# Patient Record
Sex: Female | Born: 1970 | Race: White | Hispanic: No | Marital: Married | Smoking: Current every day smoker
Health system: Southern US, Community
[De-identification: ages and names within clinical notes are randomized; demographics above are authoritative.]

## PROBLEM LIST (undated history)

## (undated) DIAGNOSIS — F32A Depression, unspecified: Secondary | ICD-10-CM

## (undated) DIAGNOSIS — F419 Anxiety disorder, unspecified: Secondary | ICD-10-CM

## (undated) DIAGNOSIS — M1990 Unspecified osteoarthritis, unspecified site: Secondary | ICD-10-CM

## (undated) DIAGNOSIS — K219 Gastro-esophageal reflux disease without esophagitis: Secondary | ICD-10-CM

## (undated) HISTORY — PX: CHOLECYSTECTOMY: SHX55

## (undated) HISTORY — PX: ABDOMINAL HYSTERECTOMY: SHX81

## (undated) HISTORY — PX: NASAL SINUS SURGERY: SHX719

---

## 1998-09-27 ENCOUNTER — Other Ambulatory Visit: Admission: RE | Admit: 1998-09-27 | Discharge: 1998-09-27 | Payer: Self-pay | Admitting: Obstetrics and Gynecology

## 1999-05-03 ENCOUNTER — Inpatient Hospital Stay (HOSPITAL_COMMUNITY): Admission: AD | Admit: 1999-05-03 | Discharge: 1999-05-06 | Payer: Self-pay | Admitting: Obstetrics and Gynecology

## 1999-05-03 ENCOUNTER — Encounter (INDEPENDENT_AMBULATORY_CARE_PROVIDER_SITE_OTHER): Payer: Self-pay | Admitting: Specialist

## 1999-06-11 ENCOUNTER — Other Ambulatory Visit: Admission: RE | Admit: 1999-06-11 | Discharge: 1999-06-11 | Payer: Self-pay | Admitting: Obstetrics and Gynecology

## 2000-03-04 ENCOUNTER — Encounter (INDEPENDENT_AMBULATORY_CARE_PROVIDER_SITE_OTHER): Payer: Self-pay

## 2000-03-04 ENCOUNTER — Other Ambulatory Visit: Admission: RE | Admit: 2000-03-04 | Discharge: 2000-03-04 | Payer: Self-pay | Admitting: Otolaryngology

## 2000-06-17 ENCOUNTER — Other Ambulatory Visit: Admission: RE | Admit: 2000-06-17 | Discharge: 2000-06-17 | Payer: Self-pay | Admitting: Obstetrics and Gynecology

## 2000-12-29 ENCOUNTER — Encounter (INDEPENDENT_AMBULATORY_CARE_PROVIDER_SITE_OTHER): Payer: Self-pay | Admitting: Specialist

## 2000-12-29 ENCOUNTER — Ambulatory Visit (HOSPITAL_BASED_OUTPATIENT_CLINIC_OR_DEPARTMENT_OTHER): Admission: RE | Admit: 2000-12-29 | Discharge: 2000-12-29 | Payer: Self-pay | Admitting: *Deleted

## 2001-07-18 ENCOUNTER — Other Ambulatory Visit: Admission: RE | Admit: 2001-07-18 | Discharge: 2001-07-18 | Payer: Self-pay | Admitting: Obstetrics and Gynecology

## 2001-11-09 ENCOUNTER — Encounter: Payer: Self-pay | Admitting: Obstetrics and Gynecology

## 2001-11-09 ENCOUNTER — Ambulatory Visit (HOSPITAL_COMMUNITY): Admission: RE | Admit: 2001-11-09 | Discharge: 2001-11-09 | Payer: Self-pay | Admitting: Obstetrics and Gynecology

## 2002-05-24 ENCOUNTER — Inpatient Hospital Stay (HOSPITAL_COMMUNITY): Admission: AD | Admit: 2002-05-24 | Discharge: 2002-05-24 | Payer: Self-pay | Admitting: Obstetrics and Gynecology

## 2002-09-23 ENCOUNTER — Inpatient Hospital Stay (HOSPITAL_COMMUNITY): Admission: AD | Admit: 2002-09-23 | Discharge: 2002-09-26 | Payer: Self-pay | Admitting: Obstetrics and Gynecology

## 2002-11-06 ENCOUNTER — Other Ambulatory Visit: Admission: RE | Admit: 2002-11-06 | Discharge: 2002-11-06 | Payer: Self-pay | Admitting: Obstetrics and Gynecology

## 2003-11-21 ENCOUNTER — Other Ambulatory Visit: Admission: RE | Admit: 2003-11-21 | Discharge: 2003-11-21 | Payer: Self-pay | Admitting: Obstetrics and Gynecology

## 2004-12-15 ENCOUNTER — Other Ambulatory Visit: Admission: RE | Admit: 2004-12-15 | Discharge: 2004-12-15 | Payer: Self-pay | Admitting: Obstetrics and Gynecology

## 2008-06-04 ENCOUNTER — Encounter (INDEPENDENT_AMBULATORY_CARE_PROVIDER_SITE_OTHER): Payer: Self-pay | Admitting: Obstetrics and Gynecology

## 2008-06-04 ENCOUNTER — Ambulatory Visit (HOSPITAL_COMMUNITY): Admission: RE | Admit: 2008-06-04 | Discharge: 2008-06-05 | Payer: Self-pay | Admitting: Obstetrics and Gynecology

## 2009-10-19 ENCOUNTER — Emergency Department (HOSPITAL_BASED_OUTPATIENT_CLINIC_OR_DEPARTMENT_OTHER): Admission: EM | Admit: 2009-10-19 | Discharge: 2009-10-19 | Payer: Self-pay | Admitting: Emergency Medicine

## 2010-09-22 ENCOUNTER — Other Ambulatory Visit: Payer: Self-pay | Admitting: Obstetrics and Gynecology

## 2010-09-22 DIAGNOSIS — R928 Other abnormal and inconclusive findings on diagnostic imaging of breast: Secondary | ICD-10-CM

## 2010-09-25 ENCOUNTER — Ambulatory Visit
Admission: RE | Admit: 2010-09-25 | Discharge: 2010-09-25 | Disposition: A | Discharge status: Home / Self Care | Payer: BC Managed Care – PPO | Source: Ambulatory Visit | Attending: Obstetrics and Gynecology | Admitting: Obstetrics and Gynecology

## 2010-09-25 DIAGNOSIS — R928 Other abnormal and inconclusive findings on diagnostic imaging of breast: Secondary | ICD-10-CM

## 2010-09-25 IMAGING — MG MM DIGITAL DIAGNOSTIC UNILAT L {BCG}
3 series · 3 of 3 positions shown · non-contrast
Comparison: Multiple priors

CLINICAL DATA: Abnormal screening, left breast

DIGITAL DIAGNOSTIC LEFT MAMMOGRAM WITHOUT CAD AND LEFT BREAST
ULTRASOUND:

[L MLO]
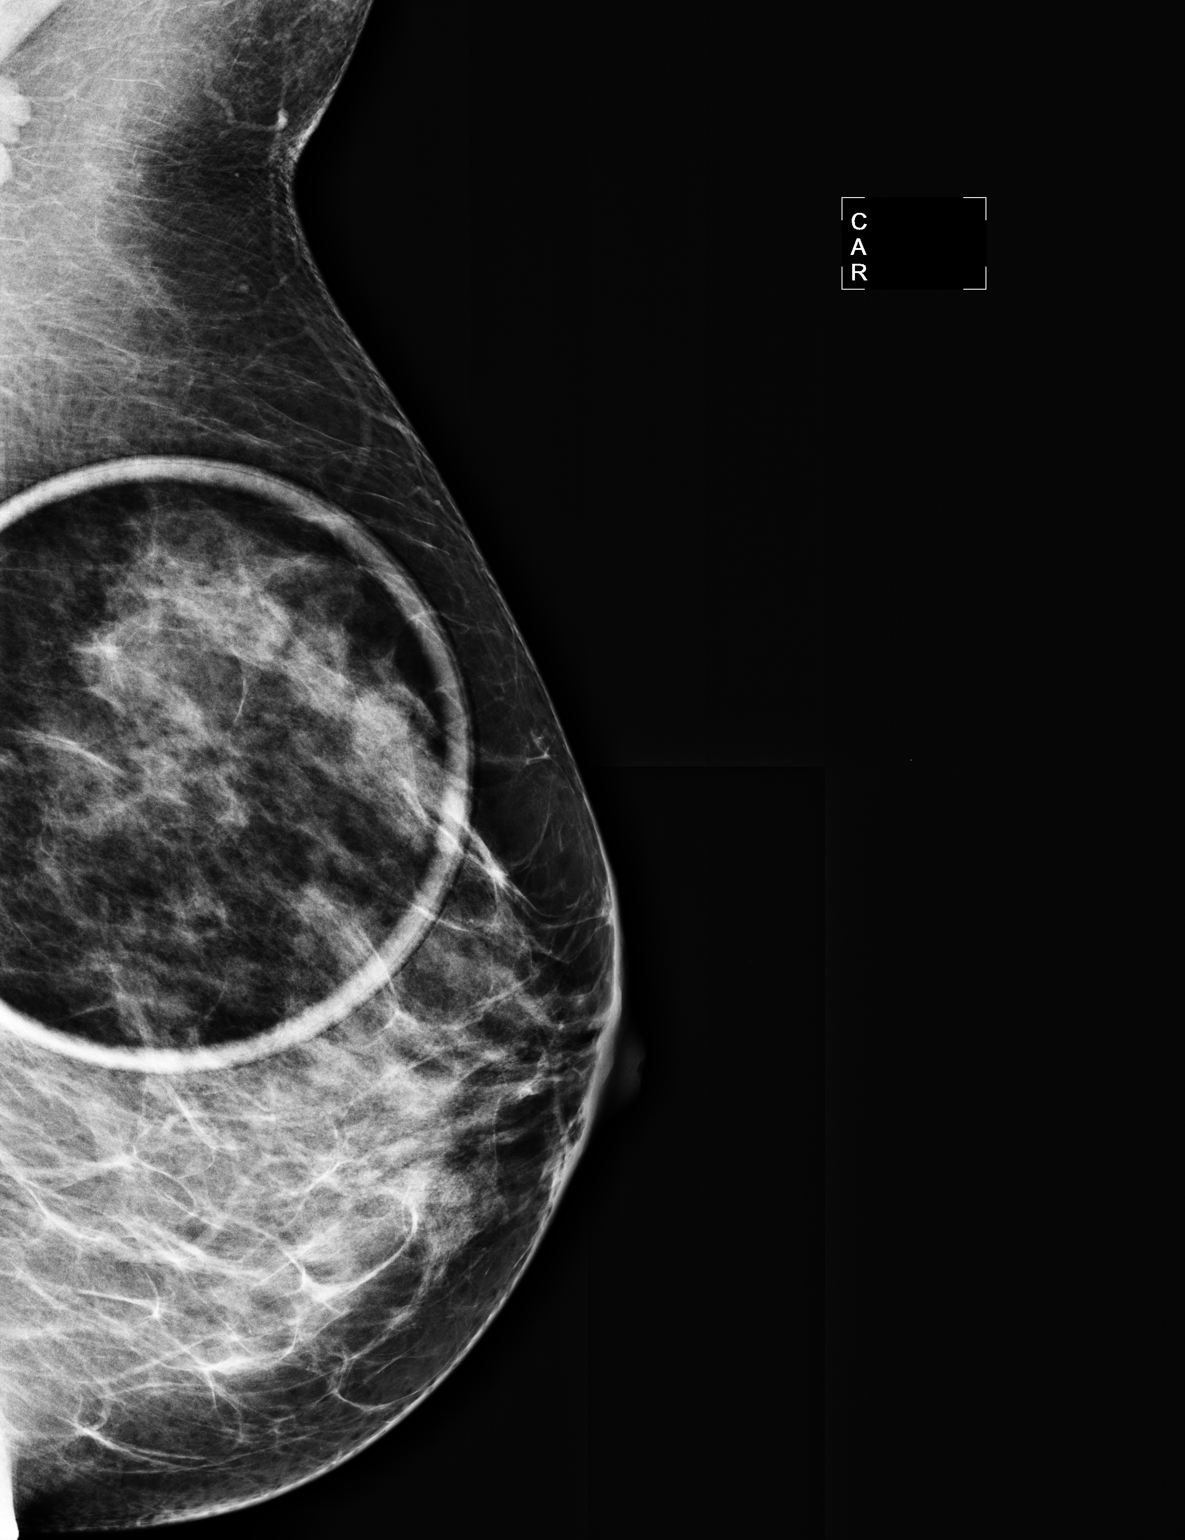

[L ML]
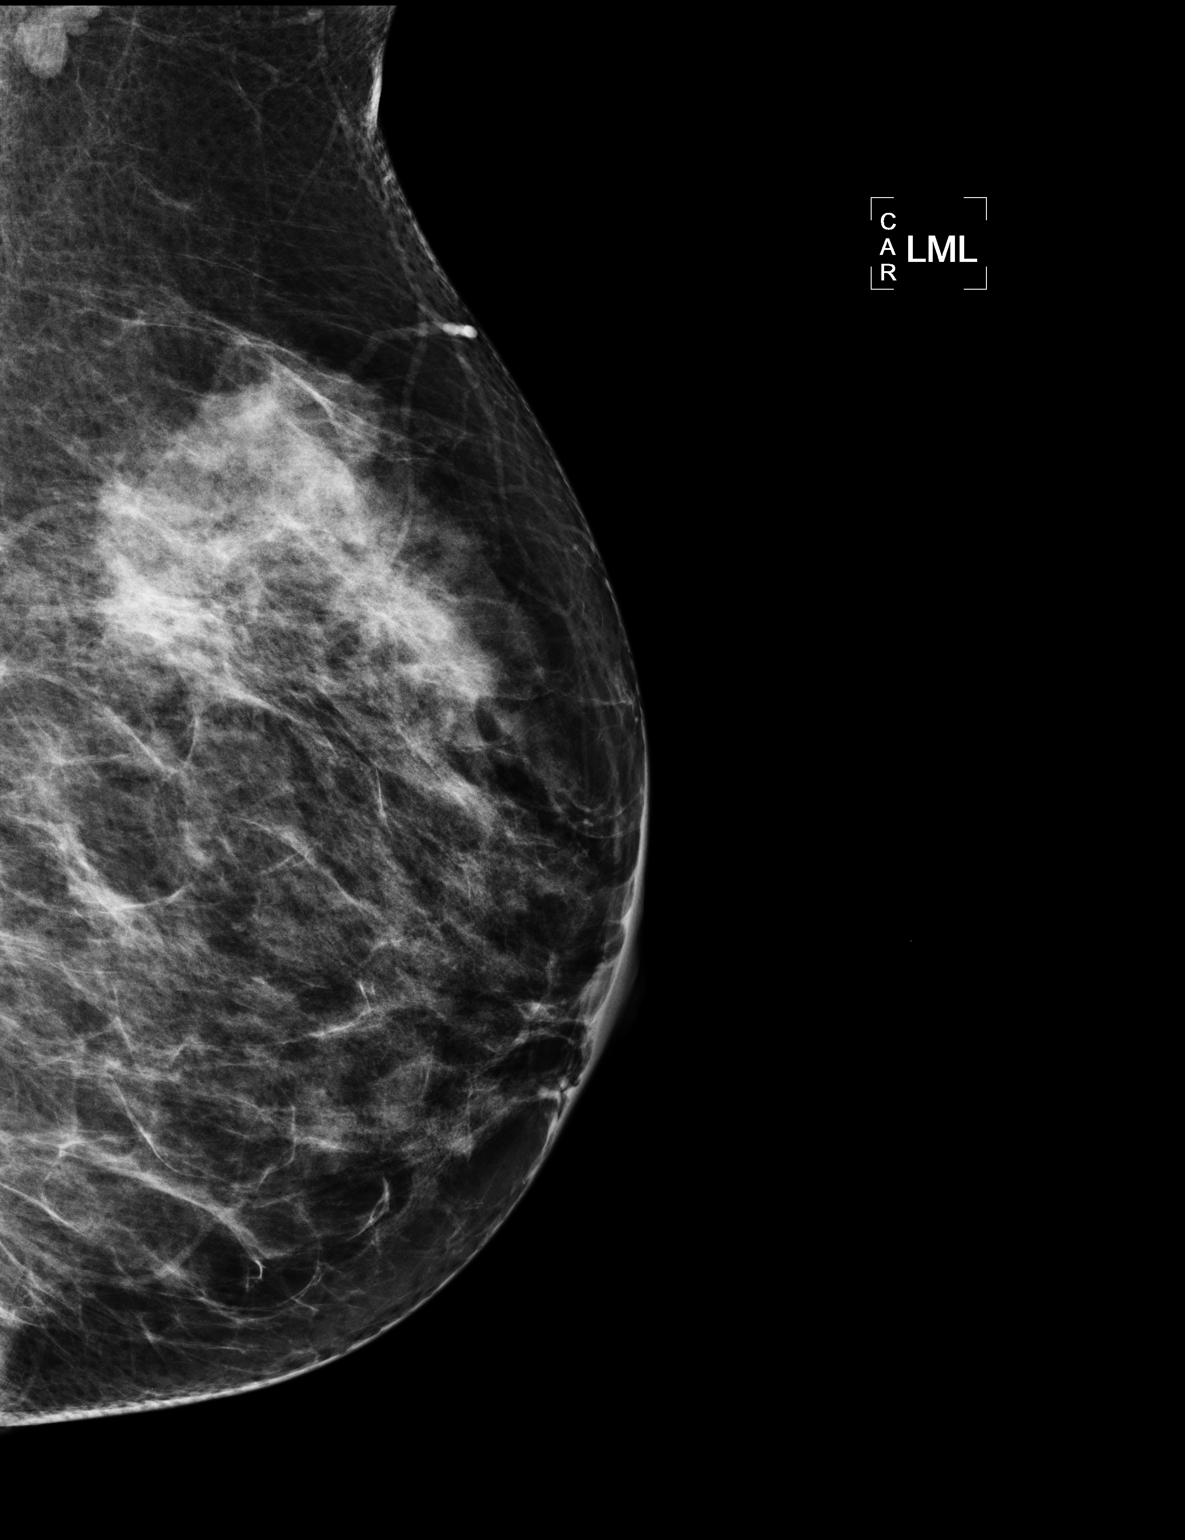

[L CC]
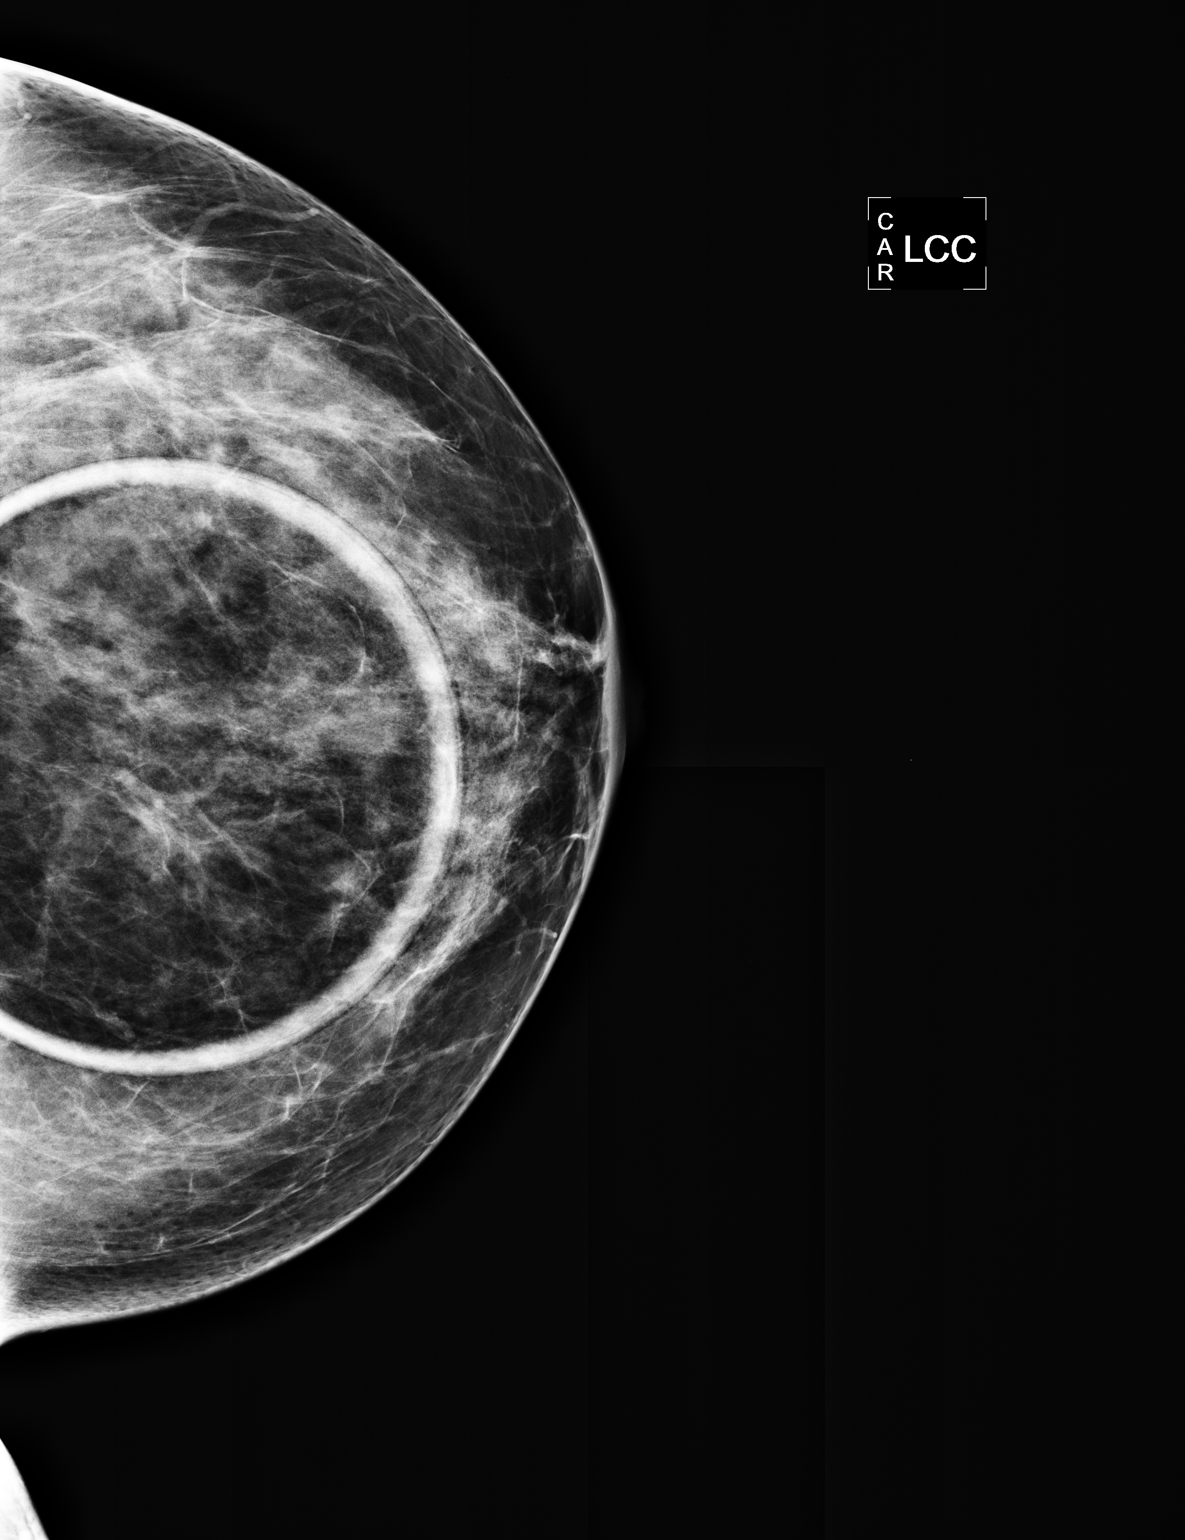

[3 of 3 positions shown; findings below may reference images not displayed]

FINDINGS: 90 degrees lateral and spot compression views in the
upper central aspect of the right breast show fibroglandular
tissue.  No discrete mass is imaged.

On physical exam, I palpate normal tissue in the upper central
aspect of the left breast.

Ultrasound is performed, showing normal ultrasound of the upper
aspect and lateral aspect of the left breast.
IMPRESSION: Normal ultrasound, left breast as above.  Recommend routine
screening mammography in 1 year.

BI-RADS CATEGORY 1:  Negative.

## 2010-10-21 ENCOUNTER — Other Ambulatory Visit: Payer: Self-pay | Admitting: Dermatology

## 2010-12-30 NOTE — H&P (Signed)
April Hardy, April Hardy NO.:  [id number]   MEDICAL RECORD NO.:  [id number]          PATIENT TYPE:   LOCATION:                                 FACILITY:   PHYSICIAN:  [name], M.D.    DATE OF BIRTH:   DATE OF ADMISSION:  06/04/2008  DATE OF DISCHARGE:                              HISTORY & PHYSICAL   CHIEF COMPLAINT:  Chronic pelvic pain, dysmenorrhea, dyspareunia, and  history of endometriosis.   Summary of the history and physical exam, please see admission H and P  for details.  Briefly, a 40 year old G2, P2, 2 previous cesarean  sections, currently on she is in a continuous OCP not only for  contraception, but to help with suppression of endometriosis.  Her pain  is worsened over the last several years to the point that she now  presents for definitive hysterectomy.  She does have a history of  endometriosis, although was able to conceive with Clomid and has had 2  children by cesarean section.  The procedure of LAVH, possible USO or  BSO discussed with her.  Specifically risks related to bleeding,  infection, adjacent organ injury, the possible need for open additional  surgery, risks related to transfusion, expected recovery time, need for  an ERT were all discussed with her.  Preferences to preserve both  ovaries if normal.  Dr. Brynda Greathouse has performed hydrodistention of the  bladder at the same time for acid symptoms.   PAST MEDICAL HISTORY:   ALLERGIES:  CODEINE.   OPERATIONS:  Cesarean section x2 and sinus surgery.   CURRENT MEDICATIONS:  1. Lexapro 20 mg daily.  2. She is on Xanax p.r.n.  3. Lunesta p.r.n. for sleep.   REVIEW OF SYSTEMS:  Significant for gallstone.  She is scheduled to have  cholecystectomy January 2010.  In the past, she has had problems related  to anxiety and depression.   SOCIAL HISTORY:  Her husband has struggled with alcoholism.  Smokes one-  half PPD and does denies recreational drugs.  She does drink  alcohol.   FAMILY HISTORY:  Significant for hypertension in both mother and father.   PHYSICAL EXAMINATION:  VITAL SIGNS:  Temperature 98.2 and blood pressure  98/70.  HEENT:  Unremarkable.  NECK:  Supple without masses.  LUNGS:  Clear.  CARDIOVASCULAR:  Regular rate and rhythm without murmurs, rubs, or  gallops.  BREASTS:  Without masses.  ABDOMEN:  Soft, flat, and nontender.  PELVIC:  Normal external genitalia.  Vagina and cervix clear.  Uterus  mid position, normal in size, and mobile.  Adnexa negative.  No unusual  nodularity or tenderness.  EXTREMITIES/NEUROLOGIC:  Unremarkable.   IMPRESSION:  1. Dysmenorrhea, dyspareunia, pelvic pain, and history of      endometriosis.  2. Ultrasound on April 2008 showing possible adenomyosis and a small      leiomyoma.  3. Interstitial cystitis.   PLAN:  Dr. Brynda Greathouse performed hydrodistention followed by LAVH, USO, and  possible BSO depending on the findings.  Procedure and risks reviewed as  above.      [name], M.D.  Electronically Signed     RMH/MEDQ  D:  05/29/2008  T:  05/30/2008  Job:  [id number]

## 2010-12-30 NOTE — Op Note (Signed)
NAMEBRITISH, April Hardy             ACCOUNT NO.:  [id number]   MEDICAL RECORD NO.:  [id number]          PATIENT TYPE:  AMB   LOCATION:  SDC                           [facility]   PHYSICIAN:  [name], M.D.DATE OF BIRTH:  [birth year]   DATE OF PROCEDURE:  06/04/2008  DATE OF DISCHARGE:                               OPERATIVE REPORT   PREOPERATIVE DIAGNOSIS:  1. Chronic pelvic pain.  2. History of endometriosis.  3. Interstitial cystitis.   POSTOPERATIVE DIAGNOSES:  1. Chronic pelvic pain.  2. History of endometriosis.  3. Interstitial cystitis.   PROCEDURE:  1. Cystoscopy and hydrodistention dictated by Dr. [name].  2. Laparoscopic-assisted vaginal hysterectomy, left salpingo-      oophorectomy.   SURGEON:  [name], MD   ASSISTANT:  [name], MD   ANESTHESIA:  General.   COMPLICATIONS:  None.   DRAINS:  Foley catheter.   BLOOD LOSS:  150 mL.   PROCEDURE AND FINDINGS:  The patient was taken to the operating room.  After an adequate level of general anesthesia was obtained with the  patient's legs in stirrups, the abdomen, perineum, and vagina were  prepped and draped in usual manner for laparoscopic procedures.  Prior  to the gynecologic procedure, Dr. [name] dictated separately his  cystoscopy and hydrodistention.  Foley catheter was positioned draining  the [name].  Hulka tenaculum was positioned.  Attention directed to the abdomen where the subumbilical area was  infiltrated with 0.25% Marcaine plain.  A small incision was made.  The  Veress needle was introduced without difficulty.  Its intra-abdominal  position was verified by pressure and water testing.  After 2.5 L  pneumoperitoneum was created, laparoscopic trocar and sleeve were then  introduced without difficulty.  Three fingerbreadths above the symphysis  in the midline, a 5-mm trocar was positioned and a blunt probe was  inserted for manipulation.   The patient placed in Trendelenburg and the  pelvic findings as follows.   Right tube and ovary were unremarkable.  The uterus itself was normal  size, cul-de-sac was free and clear.  There was some anterior bladder  flap scarring from a prior cesarean section.  Several solitary spots of  endometriosis along the medial margin of the left ovary.  Appendix and  upper abdomen were otherwise unremarkable.  Atraumatic grasper then used  to grasp the left tube and ovary, placed on traction toward the midline.  The course of the left pelvic ureter was well below.  Using the Enseal  device, the left IP ligament was coagulated and divided down to and  including the round ligament on that side.  Excellent hemostasis.  On  the right side, the right ovary was conserved.  The right utero-ovarian  pedicle was coagulated and divided down to including the right round  ligament.  Vaginal portion of the procedure was started at this point,  the legs were extended.  Weighted speculum position.  Cervical vaginal  mucosa was incised posterior colpotomy performed without difficulty.  The bladder was advanced superiorly with sharp and blunt dissection to  the peritoneum could be identified.  This was entered sharply and  retractor used to gently elevated the bladder out of the field.  Using  the handheld LigaSure device, the uterosacral ligament on each side was  coagulated and divided.  In sequential manner, the cardinal ligament,  uterine vasculature pedicles and upper broad ligament pedicles were  coagulated and divided.  The fundus was then delivered posteriorly and  remaining pedicles were coagulated and divided.  This was removed with  the attached left tube and ovary.  The cuff was then closed with 3 and 9  o'clock with a running locked 2-0 Vicryl suture.  This was hemostatic.  Prior to closure sponge, needle, and instrument counts were reported  correct x2.  2-0 Monocryl interrupted sutures were then  used to close  the vaginal mucosa right-to-left with excellent hemostasis.  The Foley  was draining continuously.  Repeat laparoscopy with Nezhat  and  irrigation revealed excellent hemostasis even at reduced pressures.  Instruments were removed, gas allowed to escape.  The defects were  closed with 4-0 Dexon subcuticular sutures and Dermabond.  She tolerated  this well and went to recovery room in good condition.      [name], M.D.  Electronically Signed     RMH/MEDQ  D:  06/04/2008  T:  06/04/2008  Job:  [id number]

## 2010-12-30 NOTE — Op Note (Signed)
NAMEFONTELLA, April Hardy             ACCOUNT NO.:  [id number]   MEDICAL RECORD NO.:  [id number]          PATIENT TYPE:  OIB   LOCATION:  9302                          [facility]ACILITY:  [facility]   PHYSICIAN:  Excell Seltzer. [name], M.D.    DATE OF BIRTH:  1970-09-30   DATE OF PROCEDURE:  DATE OF DISCHARGE:                               OPERATIVE REPORT   PROCEDURES:  Cystoscopy, hydrodistention of bladder, urethral dilation,  installation of Pyridium and Marcaine.   PREOPERATIVE DIAGNOSIS:  Rule out interstitial cystitis.   POSTOPERATIVE DIAGNOSIS:  Urethral stenosis with painful bladder.   SURGEON:  Excell Seltzer. [name], MD   ANESTHESIA:  General.   SPECIMEN:  None.   DRAINS:  None.   COMPLICATIONS:  None.   INDICATIONS:  Atiyana is a 40 year old white female with painful bladder  and irritative voiding symptoms who is to undergo hysterectomy.  She is  to undergo hydrodistention of bladder to rule out interstitial cystitis.   FINDING OF PROCEDURE:  The patient was given Ancef.  She was taken to  the operating room where general anesthetic was induced.  She was placed  in lithotomy position.  Her perineum and genitalia were prepped with  Betadine solution.  She was draped in usual sterile fashion.   An initial attempt to pass to the 22-French cystoscope sheath was  unsuccessful due urethral stenosis.  Female sounds were then used to  dilate the urethra initially to 24-French and then later in the  procedure to 32-French with some tearing of the meatal mucosa.  Once  dilated, the cystoscope was reinserted.  The remainder of the urethra  was unremarkable.  The bladder wall had mild trabeculation but no tumor,  stones, or inflammation noted.  Ureteral orifices were unremarkable.   The bladder was distended under 100 cm of water pressure to capacity and  then drained.  Her capacity under anesthesia was 900 mL.  Recystoscopy  after dilation revealed no evidence of glomerular hemorrhages or   ulcerations.   At this point, the urethral dilation was completed as noted above to 87-  [place] and then the bladder was instilled with 30 mL of 0.25% Marcaine  with 400 mg of crushed Pyridium.  This will be left indwelling for  several minutes prior to placement of Foley for the hysterectomy.  My  portion of the procedure was completed at this point.  There were no  complications.      Excell Seltzer. [name], M.D.  Electronically Signed     JJW/MEDQ  D:  06/04/2008  T:  06/04/2008  Job:  [id number]   cc:   [name], M.D.  Fax: [phone]

## 2010-12-30 NOTE — Discharge Summary (Signed)
NAMEJAMIRAH, April Hardy             ACCOUNT NO.:  [id number]   MEDICAL RECORD NO.:  [id number]          PATIENT TYPE:  OIB   LOCATION:  9302                          [facility]ILITY:  [facility]   PHYSICIAN:  [name], M.D.DATE OF BIRTH:  1970-10-09   DATE OF ADMISSION:  06/04/2008  DATE OF DISCHARGE:  06/05/2008                               DISCHARGE SUMMARY   DISCHARGE DIAGNOSES:  1. Chronic pelvic pain.  2. Interstitial cystitis.  3. Hydrodistention of bladder with laparoscopic-assisted vaginal      hysterectomy and left salpingo-oophorectomy at this admission.   Summary of the history and physical exam, please see admission H and P  for details.  Briefly, a 40 year old with the history of endometriosis  and chronic pelvic pain presents for definitive hysterectomy also  hydrodistention per Dr. [name].   HOSPITAL COURSE:  On June 04, 2008, under general anesthesia, the  patient underwent hydrodistention and cystoscopy followed by LAVH and  LSO.  Her right ovary was conserved.  The a.m. following surgery her  catheter was removed.  She was voiding without difficulty, afebrile.  Her abdominal exam was unremarkable.  She was tolerating regular diet  and was ready for discharge at that point.   LAB DATA:  Blood type is A positive, antibody screen negative.  UPT  negative.  CBC on admission on June 04, 2008, WBC 9.8, hemoglobin  13.4, hematocrit 39.7.  Postop CBC on June 05, 2008, WBC 11.5,  hemoglobin 11.4, and hematocrit 33.6.   DISPOSITION:  The patient was discharged on Darvocet-N 100 1-2 p.o. q.4-  6 hours p.r.n. pain or return to office in 1 week.   Has followup instructions to see Dr. [name] in 2 weeks.  Advised to  report any incisional redness or drainage, increased pain or bleeding,  or fever over 101.  She was given specific instructions regarding diet,  sex, and exercise.   CONDITION:  Good.   ACTIVITY:  Graded increase.      [name], M.D.  Electronically Signed     RMH/MEDQ  D:  06/05/2008  T:  06/05/2008  Job:  [id number]

## 2011-01-02 NOTE — Discharge Summary (Signed)
NAMEROSELENE, GRAY NO.:  [id number]   MEDICAL RECORD NO.:  [id number]                   PATIENT TYPE:   LOCATION:                                       FACILITY:   PHYSICIAN:  Tracie Harrier, M.D.              DATE OF BIRTH:   DATE OF ADMISSION:  09/23/2002  DATE OF DISCHARGE:  09/26/2002                                 DISCHARGE SUMMARY   ADMISSION DIAGNOSES:  1. Intrauterine pregnancy at 38 weeks, estimated gestational age.  2. Previous Cesarean section delivery.  3. Active labor.   DISCHARGE DIAGNOSES:  1. Status post low transverse Cesarean section.  2. Viable female infant.   PROCEDURE:  Repeat low transverse Cesarean section.   REASON FOR ADMISSION:  Please see written history and physical.   HISTORY OF PRESENT ILLNESS:  The patient was admitted to [facility] at term in active labor. The patient had had a previous Cesarean  section delivery and desired repeat. The patient was noted to be 4 cm  dilated. Fetal heart tones were reassuring. The patient was prepped  accordingly and taken to the OR where spinal anesthesia was administered  without difficulty. A low transverse incision was made with the delivery of  a viable female infant weighing 6 pounds and 6 ounces with Apgar's of 8 at  one minute and 9 at five minutes. The patient tolerated the procedure well  and was taken to the recovery room  in stable condition. On postoperative  day one, the patient had good pain relief with oral medications. Abdomen was  soft. Fundus was firm and nontender. The patient had good return of bowel  function. Vital signs were stable. The patient was afebrile. Abdominal  dressing was clean, dry and intact. Labs revealed hemoglobin of 8.4,  platelet count of 211,000, and WBC count of 9.4. On postoperative day two,  vital signs were stable. Fundus was firm and nontender. Abdominal dressing  was removed revealing an incision that was  clean, dry, and intact. The  patient was started on iron supplementation. She was tolerating a regular  diet without complaints of nausea and vomiting. On postoperative day three,  fundus was firm and nontender. Abdomen was soft. Incision was clean, dry and  intact. Slight ecchymosis was noted inferior to the incisional site. Staples  were removed. The patient was discharged to home.   CONDITION ON DISCHARGE:  Good.   DIET:  Regular as tolerated.   ACTIVITY:  No heavy lifting, no driving times two weeks, no vaginal entry.   FOLLOW UP:  The patient is to follow-up in the office in one week for an  incision check. She should call for temperature greater than 100 degrees,  persistent nausea and vomiting, heavy vaginal bleeding, any redness or  drainage from the incision site.   DISCHARGE MEDICATIONS:  1. Tylox #30 one by mouth every four to six hours as  needed pain.  2. Motrin 600 mg #30 one by mouth every six hours as needed.  3. Niferex 150 mg #30 one by mouth daily.  4. Prenatal vitamins one by mouth daily.  5. Colace one by mouth daily as needed.     Julio Sicks, N.P.                        Tracie Harrier, M.D.    CC/MEDQ  D:  10/24/2002  T:  10/25/2002  Job:  [id number]   cc:   Tracie Harrier, M.D.  [address] [place]  [place] [address]  Fax: [phone]

## 2011-01-02 NOTE — Op Note (Signed)
. [facility]  Patient:    April Hardy, April Hardy                    MRN: [id number] Proc. Date: 12/29/00 Adm. Date:  [id number] Attending:  Melody Comas.                           Operative Report  PREOPERATIVE DIAGNOSIS:  Basal cell carcinoma, left medial canthal area, approximately 5 mm.  POSTOPERATIVE DIAGNOSIS:  Basal cell carcinoma, left medial canthal area, approximately 5 mm.  OPERATION PERFORMED:  Excisional biopsy of 5 mm basal cell carcinoma of the left medial canthal area.  SURGEON:  Janet Berlin. Dan Humphreys, M.D.  ASSISTANT:  None.  ANESTHESIA:  Local.  INDICATIONS FOR PROCEDURE:  The patient is a 40 year old young woman who has a previously biopsied skin lesion in the left medial canthal area showing basal cell carcinoma.  The margins of resection were felt to be potentially involved.  She was brought to the operating room at this time for excisional biopsy in order to obtain clear tissue margins of resection.  DESCRIPTION OF PROCEDURE:  The patient was placed on the operating table in supine position with the head turned slightly to the right.  Using a hand held mirror, I asked her to positively identify the precise area where the previous biopsy had been done.  This was then marked with a surgical marking pen.  I then sterilely prepped and draped the area.  1% Xylocaine with epinephrine was used to provide local anesthesia.  The patient was asked to squint and I marked the lines of relaxed skin tension.  The excision was planned along these lines.  Using an elliptically shaped incision, the lesion was excised full thickness.  This was passed off the operative field for permanent sections.  The resulting defect was closed with interrupted 6-0 Monofilament nylon sutures.  A light coating of bacitracin ointment was applied to the operative site.  Due to its proximity to the medial canthus, no bandage was placed.  The patient was then given  wound care instructions.  She will see me in the office early next week for a wound check, suture removal and a review of her final pathology report. DD:  12/29/00 TD:  12/29/00 Job: [id number]

## 2011-01-02 NOTE — Op Note (Signed)
NAME:  April Hardy, April Hardy                       ACCOUNT NO.:  [id number]   MEDICAL RECORD NO.:  [id number]                   PATIENT TYPE:  INP   LOCATION:  9143                                 [facility]ACILITY:  [facility]   PHYSICIAN:  Guy Sandifer. Arleta Creek, M.D.           DATE OF BIRTH:  04-Nov-1970   DATE OF PROCEDURE:  09/23/2002  DATE OF DISCHARGE:                                 OPERATIVE REPORT   PREOPERATIVE DIAGNOSES:  1. Intrauterine pregnancy at 67 weeks' estimated gestational age.  2. Active labor.  3. Desires repeat cesarean section.   POSTOPERATIVE DIAGNOSES:  1. Intrauterine pregnancy at 13 weeks' estimated gestational age.  2. Active labor.  3. Desires repeat cesarean section.   PROCEDURE:  Low transverse cesarean section.   SURGEON:  Guy Sandifer. Henderson Cloud, M.D.   ANESTHESIA:  Spinal, Burnett Corrente, M.D.   ESTIMATED BLOOD LOSS:  800 mL.   FINDINGS:  A viable female infant, Apgars of 8 and 9 at one and five  minutes, respectively.  Birth weight 6 pounds 6 ounces.  Arterial cord pH  pending.   INDICATIONS AND CONSENT:  This patient is a 40 year old married white  female, a G2, P1 at 38 weeks, with previous low transverse section.  She  complains of uterine contractions.  The cervix is 4 cm dilated, 80% effaced,  to the nurse's check.  She is contracting every two to three minutes.  A  diagnosis of active labor is made.  The patient wants a repeat C-section.  Discussed with the patient and husband C-section and risks, including but  not limited to infection, bowel, bladder, or ureteral damage, bleeding  requiring transfusion of blood products and possible transfusion reaction,  HIV and hepatitis acquisition, DVT, PE, and pneumonia.  All questions are  answered and consent is signed on the chart.   DESCRIPTION OF PROCEDURE:  The patient was taken to the operating room,  where a spinal anesthetic is placed.  She is then placed in the dorsal  supine position with a 15 degree left  lateral wedge.  She is then prepped  abdominally and vaginally, a Foley catheter is placed and the bladder is  drained, and she is draped in a sterile fashion.  After testing for adequate  spinal anesthesia, skin was entered through the previous Pfannenstiel scar  and dissection is carried in our layers to the peritoneum.  The peritoneum  is entered and extended superiorly and inferiorly.  The vesicouterine  peritoneum is taken down cephalolaterally, a bladder flap is developed, and  the bladder blade is placed.  The uterus is incised in a low transverse  manner.  The uterine cavity is entered bluntly with a hemostat, and the  uterine incision is extended cephalolaterally with the fingers.  Artificial  rupture of membranes reveals clear fluid.  The baby is in the ROP position.  Vertex is delivered without difficulty.  Oropharynx and nasopharynx were  suctioned.  Nuchal cord x1 is noted and reduced.  The baby is then  delivered, good cry and tone is noted.  The cord is clamped and cut, and the  infant is handed to the waiting pediatrics team.  The placenta is manually  delivered.  The uterine cavity is cleaned.  The uterus is closed in a  running locking fashion with 0 Monocryl suture, which achieves good  hemostasis.  Tubes and ovaries palpate normally bilaterally.  Anterior  peritoneum is closed in running fashion with 0 Monocryl suture, which is  also used to reapproximate the pyramidalis muscle in the midline.  The  anterior rectus fascia is closed in a running fashion with 0 PDS suture and  the skin is closed with clips.  All sponge, instrument, and needle counts  are correct, and the patient is transferred to the recovery room in stable  condition.                                               Guy Sandifer Arleta Creek, M.D.    JET/MEDQ  D:  09/23/2002  T:  09/23/2002  Job:  [id number]

## 2011-02-13 ENCOUNTER — Other Ambulatory Visit: Payer: Self-pay | Admitting: Gastroenterology

## 2011-05-18 LAB — TYPE AND SCREEN
ABO/RH(D): A POS
Antibody Screen: NEGATIVE

## 2011-05-18 LAB — CBC
Hemoglobin: 11.4 — ABNORMAL LOW
Platelets: 303
RBC: 3.13 — ABNORMAL LOW
WBC: 11.5 — ABNORMAL HIGH
WBC: 9.8

## 2011-05-18 LAB — HCG, SERUM, QUALITATIVE: Preg, Serum: NEGATIVE

## 2011-12-09 ENCOUNTER — Other Ambulatory Visit: Payer: Self-pay | Admitting: Dermatology

## 2013-04-18 ENCOUNTER — Other Ambulatory Visit: Payer: Self-pay | Admitting: Dermatology

## 2014-08-24 ENCOUNTER — Other Ambulatory Visit: Payer: Self-pay | Admitting: Dermatology

## 2014-10-22 ENCOUNTER — Other Ambulatory Visit: Payer: Self-pay | Admitting: Obstetrics and Gynecology

## 2014-10-23 LAB — CYTOLOGY - PAP

## 2017-11-02 DIAGNOSIS — R350 Frequency of micturition: Secondary | ICD-10-CM | POA: Diagnosis not present

## 2017-11-02 DIAGNOSIS — R3915 Urgency of urination: Secondary | ICD-10-CM | POA: Diagnosis not present

## 2017-11-03 DIAGNOSIS — D518 Other vitamin B12 deficiency anemias: Secondary | ICD-10-CM | POA: Diagnosis not present

## 2017-11-10 DIAGNOSIS — R6882 Decreased libido: Secondary | ICD-10-CM | POA: Diagnosis not present

## 2017-11-23 DIAGNOSIS — R3915 Urgency of urination: Secondary | ICD-10-CM | POA: Diagnosis not present

## 2017-11-23 DIAGNOSIS — R350 Frequency of micturition: Secondary | ICD-10-CM | POA: Diagnosis not present

## 2017-11-25 DIAGNOSIS — F410 Panic disorder [episodic paroxysmal anxiety] without agoraphobia: Secondary | ICD-10-CM | POA: Diagnosis not present

## 2017-11-25 DIAGNOSIS — F3342 Major depressive disorder, recurrent, in full remission: Secondary | ICD-10-CM | POA: Diagnosis not present

## 2017-12-07 DIAGNOSIS — D519 Vitamin B12 deficiency anemia, unspecified: Secondary | ICD-10-CM | POA: Diagnosis not present

## 2017-12-09 DIAGNOSIS — D485 Neoplasm of uncertain behavior of skin: Secondary | ICD-10-CM | POA: Diagnosis not present

## 2017-12-09 DIAGNOSIS — D2262 Melanocytic nevi of left upper limb, including shoulder: Secondary | ICD-10-CM | POA: Diagnosis not present

## 2017-12-09 DIAGNOSIS — D225 Melanocytic nevi of trunk: Secondary | ICD-10-CM | POA: Diagnosis not present

## 2017-12-09 DIAGNOSIS — D2271 Melanocytic nevi of right lower limb, including hip: Secondary | ICD-10-CM | POA: Diagnosis not present

## 2017-12-09 DIAGNOSIS — Z808 Family history of malignant neoplasm of other organs or systems: Secondary | ICD-10-CM | POA: Diagnosis not present

## 2017-12-13 DIAGNOSIS — R945 Abnormal results of liver function studies: Secondary | ICD-10-CM | POA: Diagnosis not present

## 2017-12-13 DIAGNOSIS — J45909 Unspecified asthma, uncomplicated: Secondary | ICD-10-CM | POA: Diagnosis not present

## 2017-12-13 DIAGNOSIS — E7849 Other hyperlipidemia: Secondary | ICD-10-CM | POA: Diagnosis not present

## 2017-12-13 DIAGNOSIS — K219 Gastro-esophageal reflux disease without esophagitis: Secondary | ICD-10-CM | POA: Diagnosis not present

## 2017-12-14 DIAGNOSIS — R350 Frequency of micturition: Secondary | ICD-10-CM | POA: Diagnosis not present

## 2018-01-06 DIAGNOSIS — R3915 Urgency of urination: Secondary | ICD-10-CM | POA: Diagnosis not present

## 2018-01-06 DIAGNOSIS — R350 Frequency of micturition: Secondary | ICD-10-CM | POA: Diagnosis not present

## 2018-01-07 DIAGNOSIS — R590 Localized enlarged lymph nodes: Secondary | ICD-10-CM | POA: Diagnosis not present

## 2018-01-07 DIAGNOSIS — J029 Acute pharyngitis, unspecified: Secondary | ICD-10-CM | POA: Diagnosis not present

## 2018-01-07 DIAGNOSIS — H68002 Unspecified Eustachian salpingitis, left ear: Secondary | ICD-10-CM | POA: Diagnosis not present

## 2018-01-07 DIAGNOSIS — Z20828 Contact with and (suspected) exposure to other viral communicable diseases: Secondary | ICD-10-CM | POA: Diagnosis not present

## 2018-01-11 DIAGNOSIS — D519 Vitamin B12 deficiency anemia, unspecified: Secondary | ICD-10-CM | POA: Diagnosis not present

## 2018-01-18 DIAGNOSIS — Z1231 Encounter for screening mammogram for malignant neoplasm of breast: Secondary | ICD-10-CM | POA: Diagnosis not present

## 2018-01-18 DIAGNOSIS — Z6830 Body mass index (BMI) 30.0-30.9, adult: Secondary | ICD-10-CM | POA: Diagnosis not present

## 2018-01-18 DIAGNOSIS — Z01419 Encounter for gynecological examination (general) (routine) without abnormal findings: Secondary | ICD-10-CM | POA: Diagnosis not present

## 2018-02-15 DIAGNOSIS — R350 Frequency of micturition: Secondary | ICD-10-CM | POA: Diagnosis not present

## 2018-02-15 DIAGNOSIS — R3915 Urgency of urination: Secondary | ICD-10-CM | POA: Diagnosis not present

## 2018-02-15 DIAGNOSIS — D519 Vitamin B12 deficiency anemia, unspecified: Secondary | ICD-10-CM | POA: Diagnosis not present

## 2018-03-22 DIAGNOSIS — D519 Vitamin B12 deficiency anemia, unspecified: Secondary | ICD-10-CM | POA: Diagnosis not present

## 2018-03-24 DIAGNOSIS — R3915 Urgency of urination: Secondary | ICD-10-CM | POA: Diagnosis not present

## 2018-04-26 DIAGNOSIS — Z23 Encounter for immunization: Secondary | ICD-10-CM | POA: Diagnosis not present

## 2018-04-26 DIAGNOSIS — D518 Other vitamin B12 deficiency anemias: Secondary | ICD-10-CM | POA: Diagnosis not present

## 2018-04-29 DIAGNOSIS — R350 Frequency of micturition: Secondary | ICD-10-CM | POA: Diagnosis not present

## 2018-05-06 DIAGNOSIS — R05 Cough: Secondary | ICD-10-CM | POA: Diagnosis not present

## 2018-05-06 DIAGNOSIS — Z6829 Body mass index (BMI) 29.0-29.9, adult: Secondary | ICD-10-CM | POA: Diagnosis not present

## 2018-05-06 DIAGNOSIS — J189 Pneumonia, unspecified organism: Secondary | ICD-10-CM | POA: Diagnosis not present

## 2018-05-20 DIAGNOSIS — F3342 Major depressive disorder, recurrent, in full remission: Secondary | ICD-10-CM | POA: Diagnosis not present

## 2018-05-20 DIAGNOSIS — F900 Attention-deficit hyperactivity disorder, predominantly inattentive type: Secondary | ICD-10-CM | POA: Diagnosis not present

## 2018-06-01 DIAGNOSIS — D519 Vitamin B12 deficiency anemia, unspecified: Secondary | ICD-10-CM | POA: Diagnosis not present

## 2018-06-08 DIAGNOSIS — D519 Vitamin B12 deficiency anemia, unspecified: Secondary | ICD-10-CM | POA: Diagnosis not present

## 2018-06-08 DIAGNOSIS — Z0000 Encounter for general adult medical examination without abnormal findings: Secondary | ICD-10-CM | POA: Diagnosis not present

## 2018-06-08 DIAGNOSIS — E7849 Other hyperlipidemia: Secondary | ICD-10-CM | POA: Diagnosis not present

## 2018-06-09 DIAGNOSIS — R350 Frequency of micturition: Secondary | ICD-10-CM | POA: Diagnosis not present

## 2018-06-09 DIAGNOSIS — R3915 Urgency of urination: Secondary | ICD-10-CM | POA: Diagnosis not present

## 2018-06-15 DIAGNOSIS — Z0000 Encounter for general adult medical examination without abnormal findings: Secondary | ICD-10-CM | POA: Diagnosis not present

## 2018-06-15 DIAGNOSIS — R590 Localized enlarged lymph nodes: Secondary | ICD-10-CM | POA: Diagnosis not present

## 2018-06-15 DIAGNOSIS — D510 Vitamin B12 deficiency anemia due to intrinsic factor deficiency: Secondary | ICD-10-CM | POA: Diagnosis not present

## 2018-06-15 DIAGNOSIS — Z1389 Encounter for screening for other disorder: Secondary | ICD-10-CM | POA: Diagnosis not present

## 2018-06-15 DIAGNOSIS — E785 Hyperlipidemia, unspecified: Secondary | ICD-10-CM | POA: Diagnosis not present

## 2018-06-15 DIAGNOSIS — G4700 Insomnia, unspecified: Secondary | ICD-10-CM | POA: Diagnosis not present

## 2018-06-17 DIAGNOSIS — L821 Other seborrheic keratosis: Secondary | ICD-10-CM | POA: Diagnosis not present

## 2018-06-17 DIAGNOSIS — D225 Melanocytic nevi of trunk: Secondary | ICD-10-CM | POA: Diagnosis not present

## 2018-06-17 DIAGNOSIS — L570 Actinic keratosis: Secondary | ICD-10-CM | POA: Diagnosis not present

## 2018-06-17 DIAGNOSIS — L820 Inflamed seborrheic keratosis: Secondary | ICD-10-CM | POA: Diagnosis not present

## 2018-07-05 DIAGNOSIS — D519 Vitamin B12 deficiency anemia, unspecified: Secondary | ICD-10-CM | POA: Diagnosis not present

## 2018-07-07 DIAGNOSIS — R350 Frequency of micturition: Secondary | ICD-10-CM | POA: Diagnosis not present

## 2018-07-07 DIAGNOSIS — R3915 Urgency of urination: Secondary | ICD-10-CM | POA: Diagnosis not present

## 2018-08-04 DIAGNOSIS — R3915 Urgency of urination: Secondary | ICD-10-CM | POA: Diagnosis not present

## 2018-08-04 DIAGNOSIS — R350 Frequency of micturition: Secondary | ICD-10-CM | POA: Diagnosis not present

## 2018-08-11 DIAGNOSIS — D519 Vitamin B12 deficiency anemia, unspecified: Secondary | ICD-10-CM | POA: Diagnosis not present

## 2018-09-01 DIAGNOSIS — R3915 Urgency of urination: Secondary | ICD-10-CM | POA: Diagnosis not present

## 2018-09-01 DIAGNOSIS — R350 Frequency of micturition: Secondary | ICD-10-CM | POA: Diagnosis not present

## 2018-09-13 DIAGNOSIS — D510 Vitamin B12 deficiency anemia due to intrinsic factor deficiency: Secondary | ICD-10-CM | POA: Diagnosis not present

## 2018-09-29 DIAGNOSIS — R3915 Urgency of urination: Secondary | ICD-10-CM | POA: Diagnosis not present

## 2018-09-29 DIAGNOSIS — R350 Frequency of micturition: Secondary | ICD-10-CM | POA: Diagnosis not present

## 2018-10-18 DIAGNOSIS — D519 Vitamin B12 deficiency anemia, unspecified: Secondary | ICD-10-CM | POA: Diagnosis not present

## 2018-11-03 DIAGNOSIS — R350 Frequency of micturition: Secondary | ICD-10-CM | POA: Diagnosis not present

## 2018-11-03 DIAGNOSIS — R3915 Urgency of urination: Secondary | ICD-10-CM | POA: Diagnosis not present

## 2018-11-11 DIAGNOSIS — F900 Attention-deficit hyperactivity disorder, predominantly inattentive type: Secondary | ICD-10-CM | POA: Diagnosis not present

## 2018-11-11 DIAGNOSIS — F3342 Major depressive disorder, recurrent, in full remission: Secondary | ICD-10-CM | POA: Diagnosis not present

## 2018-11-11 DIAGNOSIS — F410 Panic disorder [episodic paroxysmal anxiety] without agoraphobia: Secondary | ICD-10-CM | POA: Diagnosis not present

## 2018-11-29 DIAGNOSIS — D519 Vitamin B12 deficiency anemia, unspecified: Secondary | ICD-10-CM | POA: Diagnosis not present

## 2018-12-01 DIAGNOSIS — R3915 Urgency of urination: Secondary | ICD-10-CM | POA: Diagnosis not present

## 2018-12-01 DIAGNOSIS — R350 Frequency of micturition: Secondary | ICD-10-CM | POA: Diagnosis not present

## 2018-12-08 DIAGNOSIS — F4323 Adjustment disorder with mixed anxiety and depressed mood: Secondary | ICD-10-CM | POA: Diagnosis not present

## 2018-12-14 DIAGNOSIS — J309 Allergic rhinitis, unspecified: Secondary | ICD-10-CM | POA: Diagnosis not present

## 2018-12-14 DIAGNOSIS — J019 Acute sinusitis, unspecified: Secondary | ICD-10-CM | POA: Diagnosis not present

## 2018-12-19 DIAGNOSIS — R112 Nausea with vomiting, unspecified: Secondary | ICD-10-CM | POA: Diagnosis not present

## 2018-12-19 DIAGNOSIS — K219 Gastro-esophageal reflux disease without esophagitis: Secondary | ICD-10-CM | POA: Diagnosis not present

## 2018-12-19 DIAGNOSIS — J019 Acute sinusitis, unspecified: Secondary | ICD-10-CM | POA: Diagnosis not present

## 2018-12-19 DIAGNOSIS — Z20818 Contact with and (suspected) exposure to other bacterial communicable diseases: Secondary | ICD-10-CM | POA: Diagnosis not present

## 2018-12-19 DIAGNOSIS — R05 Cough: Secondary | ICD-10-CM | POA: Diagnosis not present

## 2018-12-19 DIAGNOSIS — J309 Allergic rhinitis, unspecified: Secondary | ICD-10-CM | POA: Diagnosis not present

## 2018-12-22 DIAGNOSIS — F4323 Adjustment disorder with mixed anxiety and depressed mood: Secondary | ICD-10-CM | POA: Diagnosis not present

## 2018-12-29 DIAGNOSIS — R3915 Urgency of urination: Secondary | ICD-10-CM | POA: Diagnosis not present

## 2019-01-04 DIAGNOSIS — F4323 Adjustment disorder with mixed anxiety and depressed mood: Secondary | ICD-10-CM | POA: Diagnosis not present

## 2019-01-18 DIAGNOSIS — F4323 Adjustment disorder with mixed anxiety and depressed mood: Secondary | ICD-10-CM | POA: Diagnosis not present

## 2019-01-26 DIAGNOSIS — R350 Frequency of micturition: Secondary | ICD-10-CM | POA: Diagnosis not present

## 2019-01-26 DIAGNOSIS — R3915 Urgency of urination: Secondary | ICD-10-CM | POA: Diagnosis not present

## 2019-01-30 DIAGNOSIS — Z803 Family history of malignant neoplasm of breast: Secondary | ICD-10-CM | POA: Diagnosis not present

## 2019-01-30 DIAGNOSIS — Z800 Family history of malignant neoplasm of digestive organs: Secondary | ICD-10-CM | POA: Diagnosis not present

## 2019-01-30 DIAGNOSIS — Z01419 Encounter for gynecological examination (general) (routine) without abnormal findings: Secondary | ICD-10-CM | POA: Diagnosis not present

## 2019-01-30 DIAGNOSIS — N951 Menopausal and female climacteric states: Secondary | ICD-10-CM | POA: Diagnosis not present

## 2019-01-30 DIAGNOSIS — Z6830 Body mass index (BMI) 30.0-30.9, adult: Secondary | ICD-10-CM | POA: Diagnosis not present

## 2019-01-30 DIAGNOSIS — Z1231 Encounter for screening mammogram for malignant neoplasm of breast: Secondary | ICD-10-CM | POA: Diagnosis not present

## 2019-01-30 DIAGNOSIS — R6882 Decreased libido: Secondary | ICD-10-CM | POA: Diagnosis not present

## 2019-02-15 DIAGNOSIS — F4323 Adjustment disorder with mixed anxiety and depressed mood: Secondary | ICD-10-CM | POA: Diagnosis not present

## 2019-02-16 DIAGNOSIS — D519 Vitamin B12 deficiency anemia, unspecified: Secondary | ICD-10-CM | POA: Diagnosis not present

## 2019-02-23 DIAGNOSIS — R05 Cough: Secondary | ICD-10-CM | POA: Diagnosis not present

## 2019-02-23 DIAGNOSIS — F17200 Nicotine dependence, unspecified, uncomplicated: Secondary | ICD-10-CM | POA: Diagnosis not present

## 2019-02-23 DIAGNOSIS — Z20818 Contact with and (suspected) exposure to other bacterial communicable diseases: Secondary | ICD-10-CM | POA: Diagnosis not present

## 2019-02-23 DIAGNOSIS — Z20828 Contact with and (suspected) exposure to other viral communicable diseases: Secondary | ICD-10-CM | POA: Diagnosis not present

## 2019-02-23 DIAGNOSIS — J209 Acute bronchitis, unspecified: Secondary | ICD-10-CM | POA: Diagnosis not present

## 2019-02-23 DIAGNOSIS — J029 Acute pharyngitis, unspecified: Secondary | ICD-10-CM | POA: Diagnosis not present

## 2019-03-27 DIAGNOSIS — D485 Neoplasm of uncertain behavior of skin: Secondary | ICD-10-CM | POA: Diagnosis not present

## 2019-03-27 DIAGNOSIS — C44511 Basal cell carcinoma of skin of breast: Secondary | ICD-10-CM | POA: Diagnosis not present

## 2019-03-27 DIAGNOSIS — Z85828 Personal history of other malignant neoplasm of skin: Secondary | ICD-10-CM | POA: Diagnosis not present

## 2019-03-27 DIAGNOSIS — L820 Inflamed seborrheic keratosis: Secondary | ICD-10-CM | POA: Diagnosis not present

## 2019-03-27 DIAGNOSIS — D225 Melanocytic nevi of trunk: Secondary | ICD-10-CM | POA: Diagnosis not present

## 2019-03-27 DIAGNOSIS — D2271 Melanocytic nevi of right lower limb, including hip: Secondary | ICD-10-CM | POA: Diagnosis not present

## 2019-03-27 DIAGNOSIS — D2262 Melanocytic nevi of left upper limb, including shoulder: Secondary | ICD-10-CM | POA: Diagnosis not present

## 2019-03-29 DIAGNOSIS — D519 Vitamin B12 deficiency anemia, unspecified: Secondary | ICD-10-CM | POA: Diagnosis not present

## 2019-03-29 DIAGNOSIS — F4323 Adjustment disorder with mixed anxiety and depressed mood: Secondary | ICD-10-CM | POA: Diagnosis not present

## 2019-03-31 DIAGNOSIS — R350 Frequency of micturition: Secondary | ICD-10-CM | POA: Diagnosis not present

## 2019-03-31 DIAGNOSIS — R3915 Urgency of urination: Secondary | ICD-10-CM | POA: Diagnosis not present

## 2019-04-20 DIAGNOSIS — R3915 Urgency of urination: Secondary | ICD-10-CM | POA: Diagnosis not present

## 2019-04-20 DIAGNOSIS — R350 Frequency of micturition: Secondary | ICD-10-CM | POA: Diagnosis not present

## 2019-05-02 DIAGNOSIS — Z23 Encounter for immunization: Secondary | ICD-10-CM | POA: Diagnosis not present

## 2019-05-02 DIAGNOSIS — D519 Vitamin B12 deficiency anemia, unspecified: Secondary | ICD-10-CM | POA: Diagnosis not present

## 2019-05-08 DIAGNOSIS — F410 Panic disorder [episodic paroxysmal anxiety] without agoraphobia: Secondary | ICD-10-CM | POA: Diagnosis not present

## 2019-05-08 DIAGNOSIS — F3342 Major depressive disorder, recurrent, in full remission: Secondary | ICD-10-CM | POA: Diagnosis not present

## 2019-05-08 DIAGNOSIS — F900 Attention-deficit hyperactivity disorder, predominantly inattentive type: Secondary | ICD-10-CM | POA: Diagnosis not present

## 2019-05-09 DIAGNOSIS — C44612 Basal cell carcinoma of skin of right upper limb, including shoulder: Secondary | ICD-10-CM | POA: Diagnosis not present

## 2019-05-09 DIAGNOSIS — L988 Other specified disorders of the skin and subcutaneous tissue: Secondary | ICD-10-CM | POA: Diagnosis not present

## 2019-05-10 DIAGNOSIS — Z9189 Other specified personal risk factors, not elsewhere classified: Secondary | ICD-10-CM | POA: Diagnosis not present

## 2019-05-10 DIAGNOSIS — R6882 Decreased libido: Secondary | ICD-10-CM | POA: Diagnosis not present

## 2019-05-11 ENCOUNTER — Other Ambulatory Visit: Payer: Self-pay | Admitting: Obstetrics and Gynecology

## 2019-05-11 DIAGNOSIS — Z9189 Other specified personal risk factors, not elsewhere classified: Secondary | ICD-10-CM

## 2019-05-18 DIAGNOSIS — R350 Frequency of micturition: Secondary | ICD-10-CM | POA: Diagnosis not present

## 2019-05-20 ENCOUNTER — Ambulatory Visit
Admission: RE | Admit: 2019-05-20 | Discharge: 2019-05-20 | Disposition: A | Discharge status: Home / Self Care | Payer: BC Managed Care – PPO | Source: Ambulatory Visit | Attending: Obstetrics and Gynecology | Admitting: Obstetrics and Gynecology

## 2019-05-20 ENCOUNTER — Other Ambulatory Visit: Payer: Self-pay

## 2019-05-20 DIAGNOSIS — N6322 Unspecified lump in the left breast, upper inner quadrant: Secondary | ICD-10-CM | POA: Diagnosis not present

## 2019-05-20 DIAGNOSIS — Z9189 Other specified personal risk factors, not elsewhere classified: Secondary | ICD-10-CM

## 2019-05-20 IMAGING — MR MR BREAST BILAT WO/W CM
8 of 12 series · 33 of 48 positions shown · IV contrast (8 ml gadavist)
Comparison: Previous exam(s).

CLINICAL DATA: 47-year-old female a family history of breast
cancer, with sister diagnosed at age 50. Currently on an estrogen
patch for 6 years. Dense breasted.

LABS:  None obtained today on site.
EXAM:
BILATERAL BREAST MRI WITH AND WITHOUT CONTRAST
TECHNIQUE: Multiplanar, multisequence MR images of both breasts were obtained
prior to and following the intravenous administration of 8 ml of
Gadavist.

[Series 2: t2_tirm_tra ipat (a-p) · axial · 3.0mm · 0.66mm/px · 1 of 55 slices shown]
[im 1/55]
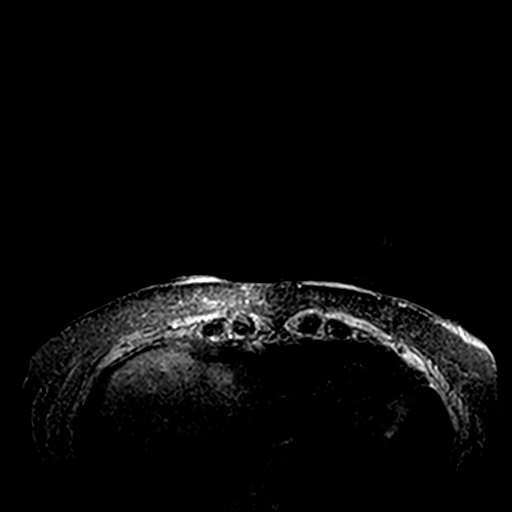

[Series 3: fl3d pre-cm no · axial · non-contrast · 1.2mm · 0.89mm/px · z∈[-84,+88]mm · 5 of 144 slices shown]
[im 1/144]
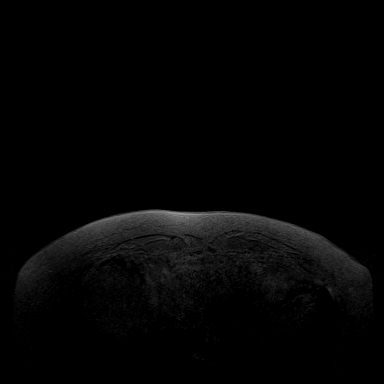
[im 36/144]
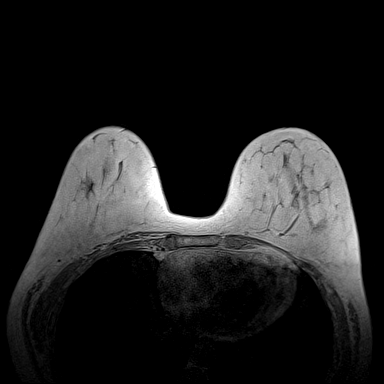
[im 72/144]
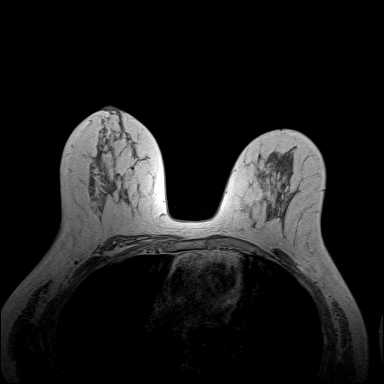
[im 108/144]
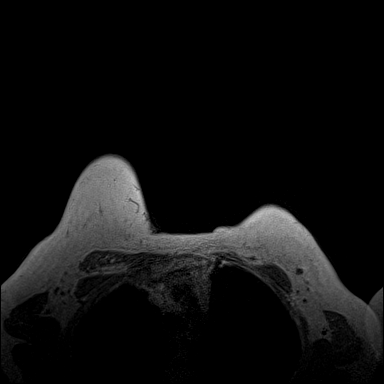
[im 144/144]
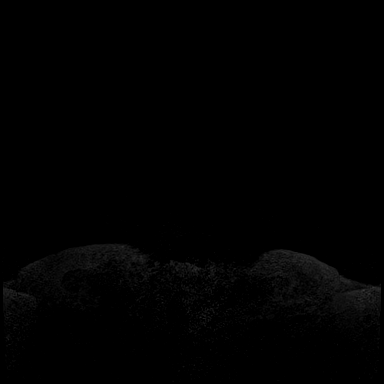

[Series 4: fl3d pre-cm · axial · non-contrast · 1.2mm · 0.89mm/px · z∈[-84,+88]mm · 5 of 144 slices shown]
[im 1/144]
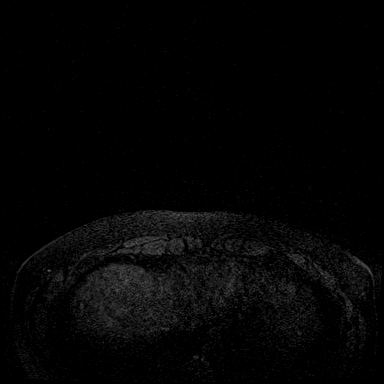
[im 36/144]
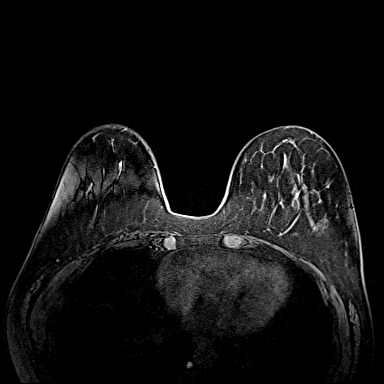
[im 72/144]
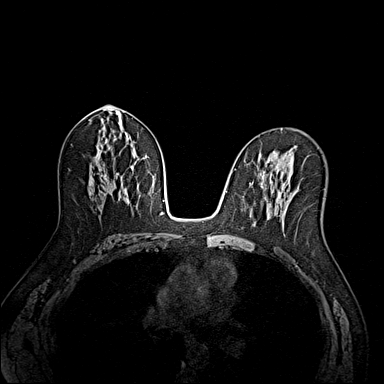
[im 108/144]
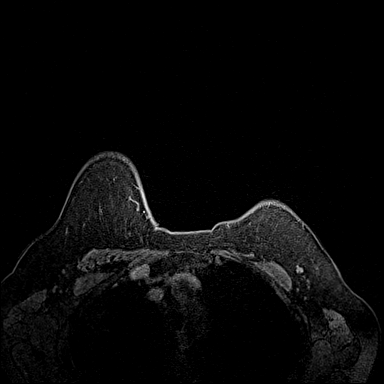
[im 144/144]
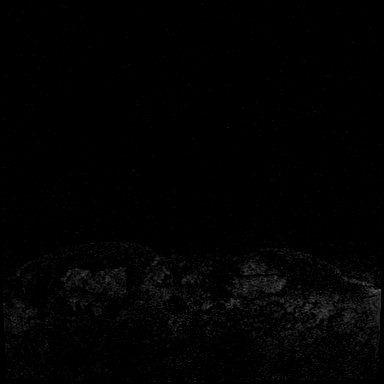

[Series 5: fl3d post immediate · axial · 1.2mm · 0.89mm/px · z∈[-84,+88]mm · 5 of 144 slices shown (1 of 3)]
[im 1/144]
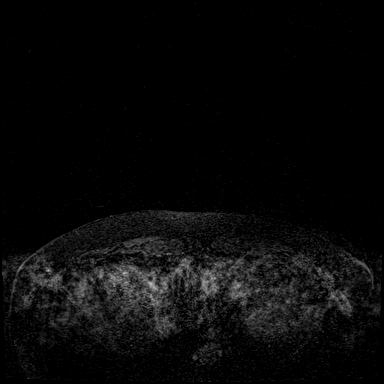
[im 36/144]
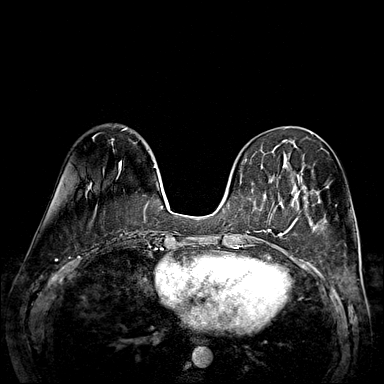
[im 72/144]
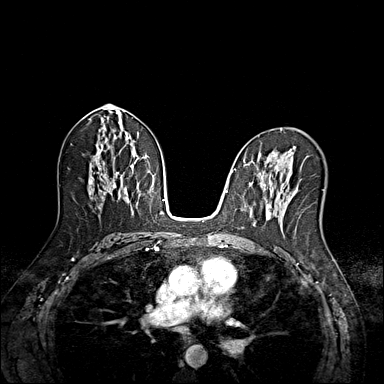
[im 108/144]
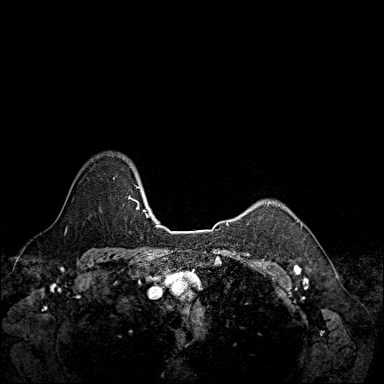
[im 144/144]
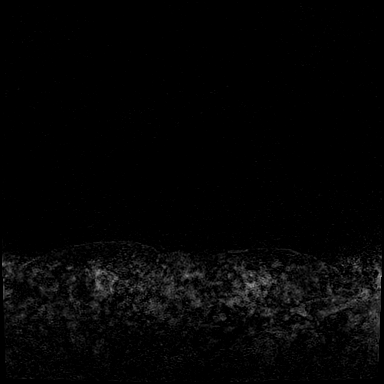

[Series 6: fl3d post immediate · axial · 1.2mm · 0.89mm/px · z∈[-84,+88]mm · 5 of 144 slices shown (2 of 3)]
[im 1/144]
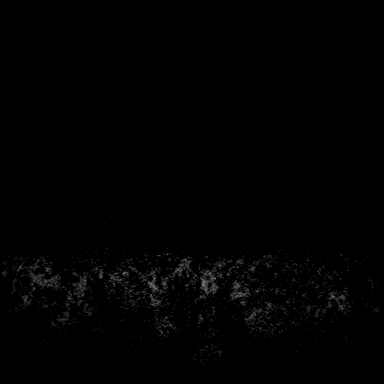
[im 36/144]
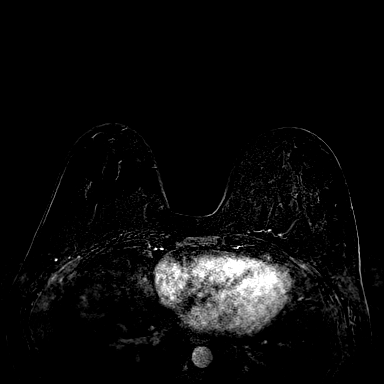
[im 72/144]
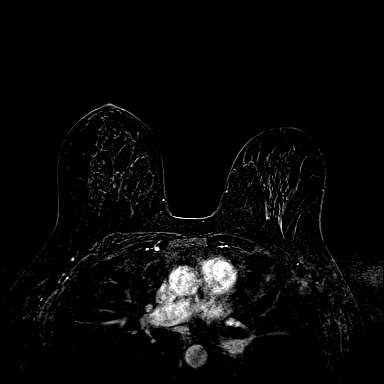
[im 108/144]
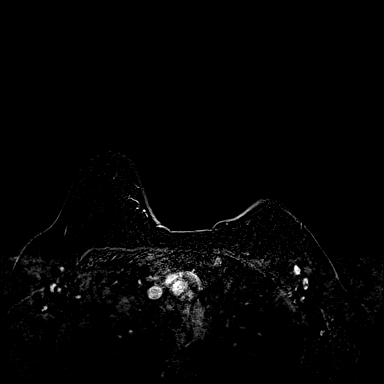
[im 144/144]
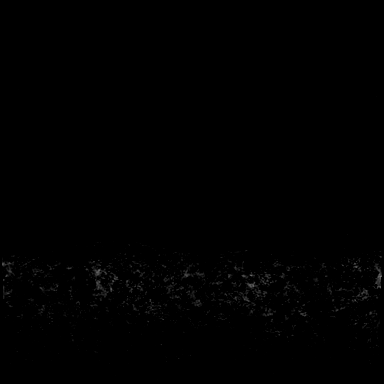

[Series 7: fl3d post immediate · axial · 172.8mm · 0.89mm/px · 1 of 1 slices shown (3 of 3)]
[im 1/1]
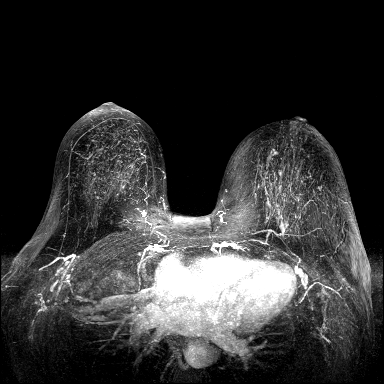

[Series 8: fl3d post 3min · axial · 1.2mm · 0.89mm/px · z∈[-84,+88]mm · 6 of 144 slices shown]
[im 1/144]
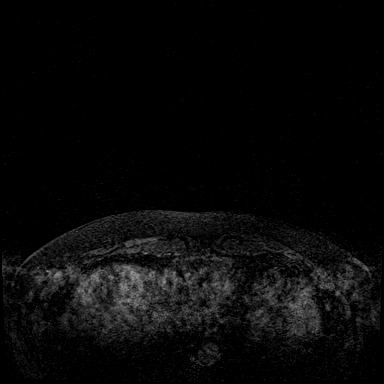
[im 29/144]
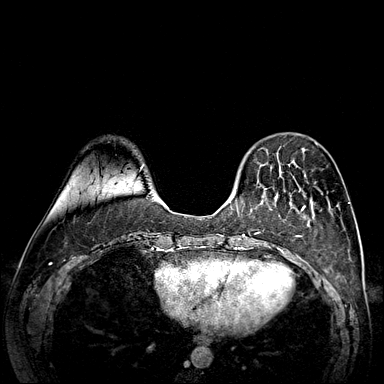
[im 58/144]
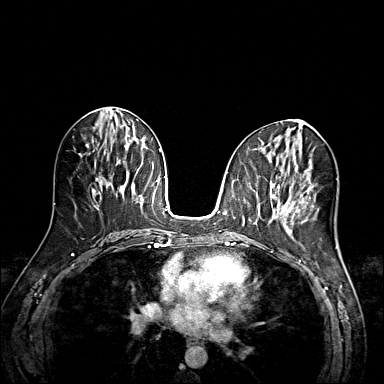
[im 86/144]
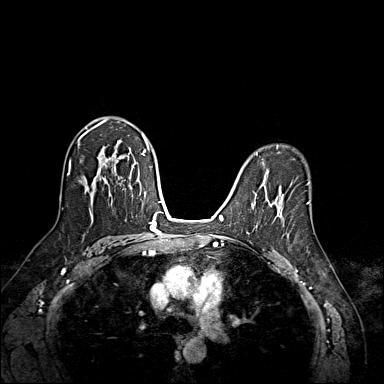
[im 115/144]
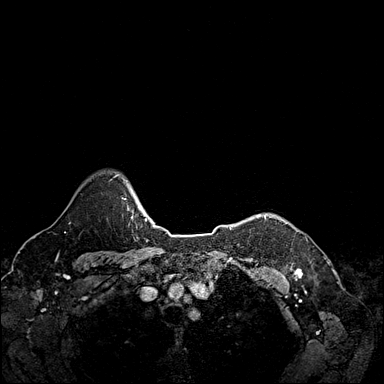
[im 144/144]
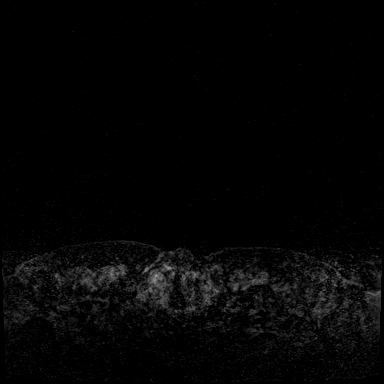

[Series 9: fl3d post 3min_sub · axial · 1.2mm · 0.89mm/px · z∈[-84,+53]mm · 5 of 144 slices shown]
[im 1/144]
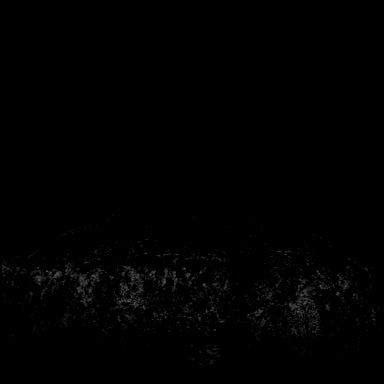
[im 29/144]
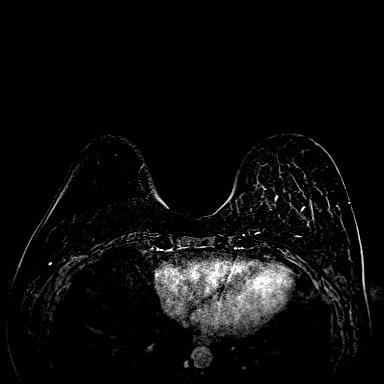
[im 58/144]
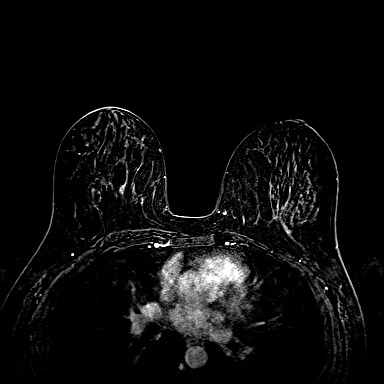
[im 86/144]
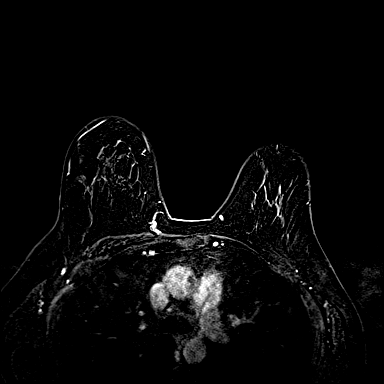
[im 115/144]
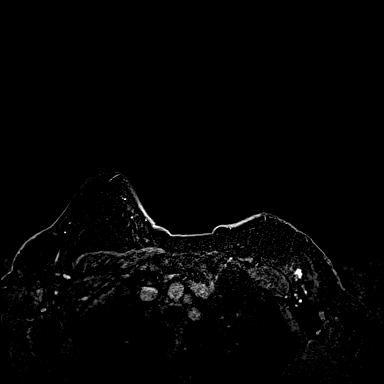

[33 of 48 positions shown; findings below may reference images not displayed]

Three-dimensional MR images were rendered by post-processing of the
original MR data on an independent workstation. The
three-dimensional MR images were interpreted, and findings are
reported in the following complete MRI report for this study. Three
dimensional images were evaluated at the independent DynaCad
workstation
FINDINGS: Breast composition: c. Heterogeneous fibroglandular tissue.

Background parenchymal enhancement: Mild-to-moderate.

Right breast: No mass or abnormal enhancement.

Left breast: There is an irregular, enhancing mass in the upper
inner left breast at anterior depth (series 6 image 76 of 144). It
measures 6 x 7 x 7 mm and demonstrates persistent kinetics. There is
no associated T2 hyperintensity.

No additional suspicious mass or enhancement is identified
throughout the remainder of the left breast.

Lymph nodes: No abnormal appearing lymph nodes.

Ancillary findings:  None.
IMPRESSION: 1. Indeterminate 6 x 7 x 7 mm enhancing mass the upper inner left
breast at anterior depth. Recommendation is for MRI guided biopsy.
2. No MRI evidence of malignancy on the right.

RECOMMENDATION:
MRI guided biopsy of the left breast.

BI-RADS CATEGORY  4: Suspicious.

## 2019-05-20 MED ORDER — GADOBUTROL 1 MMOL/ML IV SOLN
8.0000 mL | Freq: Once | INTRAVENOUS | Status: AC | PRN
  Administered 2019-05-20: 8 mL via INTRAVENOUS

## 2019-05-22 ENCOUNTER — Other Ambulatory Visit: Payer: Self-pay | Admitting: Obstetrics and Gynecology

## 2019-05-22 DIAGNOSIS — R9389 Abnormal findings on diagnostic imaging of other specified body structures: Secondary | ICD-10-CM

## 2019-05-31 DIAGNOSIS — F4323 Adjustment disorder with mixed anxiety and depressed mood: Secondary | ICD-10-CM | POA: Diagnosis not present

## 2019-06-06 ENCOUNTER — Other Ambulatory Visit: Payer: Self-pay

## 2019-06-06 ENCOUNTER — Other Ambulatory Visit (HOSPITAL_COMMUNITY): Payer: Self-pay | Admitting: Diagnostic Radiology

## 2019-06-06 ENCOUNTER — Ambulatory Visit
Admission: RE | Admit: 2019-06-06 | Discharge: 2019-06-06 | Disposition: A | Discharge status: Home / Self Care | Payer: BC Managed Care – PPO | Source: Ambulatory Visit | Attending: Obstetrics and Gynecology | Admitting: Obstetrics and Gynecology

## 2019-06-06 DIAGNOSIS — N6321 Unspecified lump in the left breast, upper outer quadrant: Secondary | ICD-10-CM | POA: Diagnosis not present

## 2019-06-06 DIAGNOSIS — R9389 Abnormal findings on diagnostic imaging of other specified body structures: Secondary | ICD-10-CM

## 2019-06-06 DIAGNOSIS — D242 Benign neoplasm of left breast: Secondary | ICD-10-CM | POA: Diagnosis not present

## 2019-06-06 DIAGNOSIS — N6322 Unspecified lump in the left breast, upper inner quadrant: Secondary | ICD-10-CM | POA: Diagnosis not present

## 2019-06-06 IMAGING — MG MM BREAST LOCALIZATION CLIP
4 series · 4 of 12 positions shown · non-contrast
Comparison: Previous exam(s).

CLINICAL DATA: Status post MR guided core biopsy of enhancing mass
in the UPPER INNER QUADRANT of the LEFT breast.

EXAM:
DIAGNOSTIC LEFT MAMMOGRAM POST MRI BIOPSY

[L CC synth-2D]
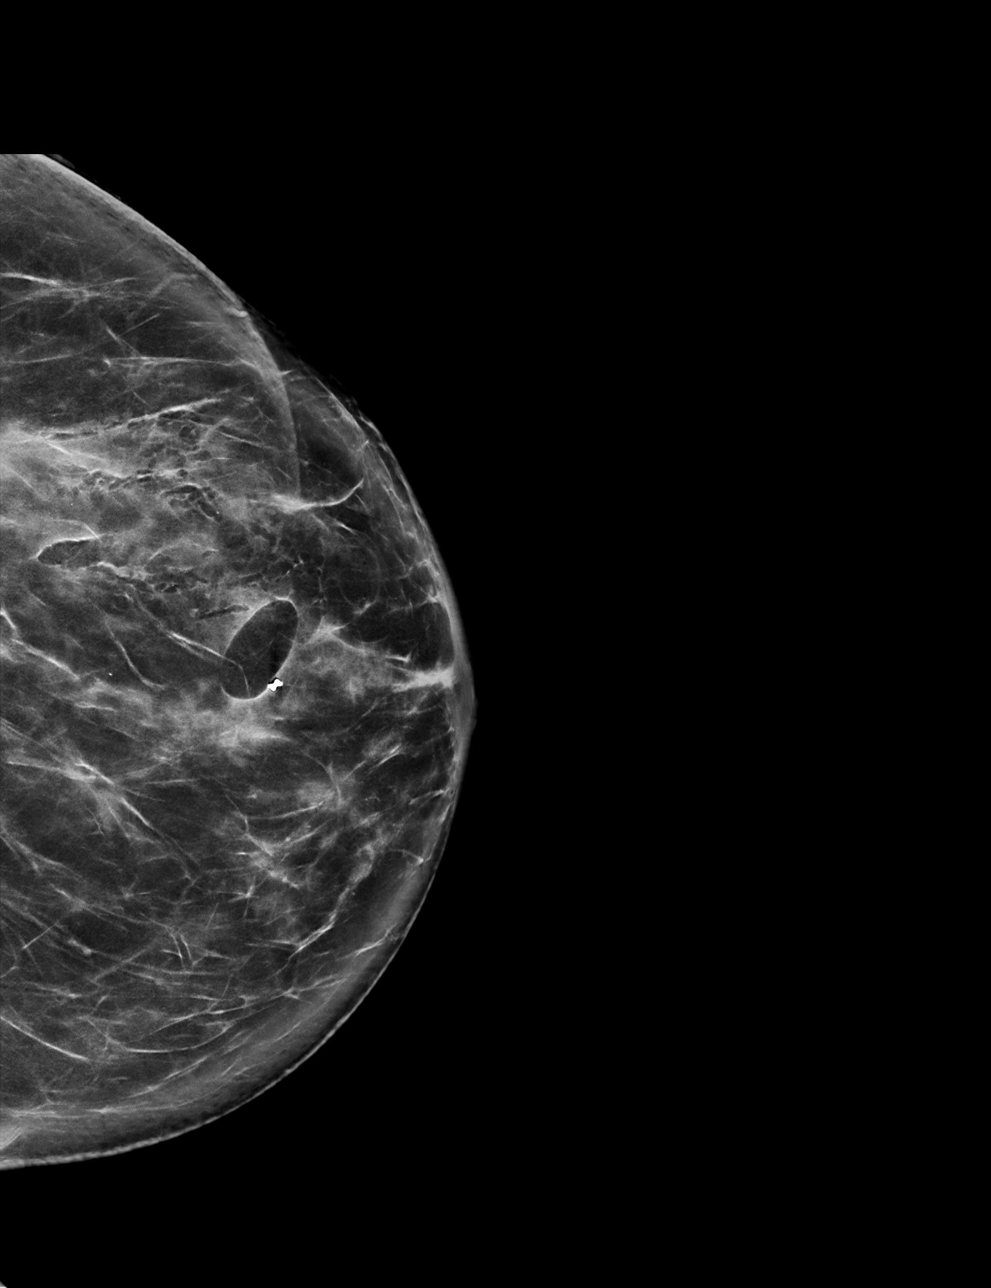

[L ML synth-2D]
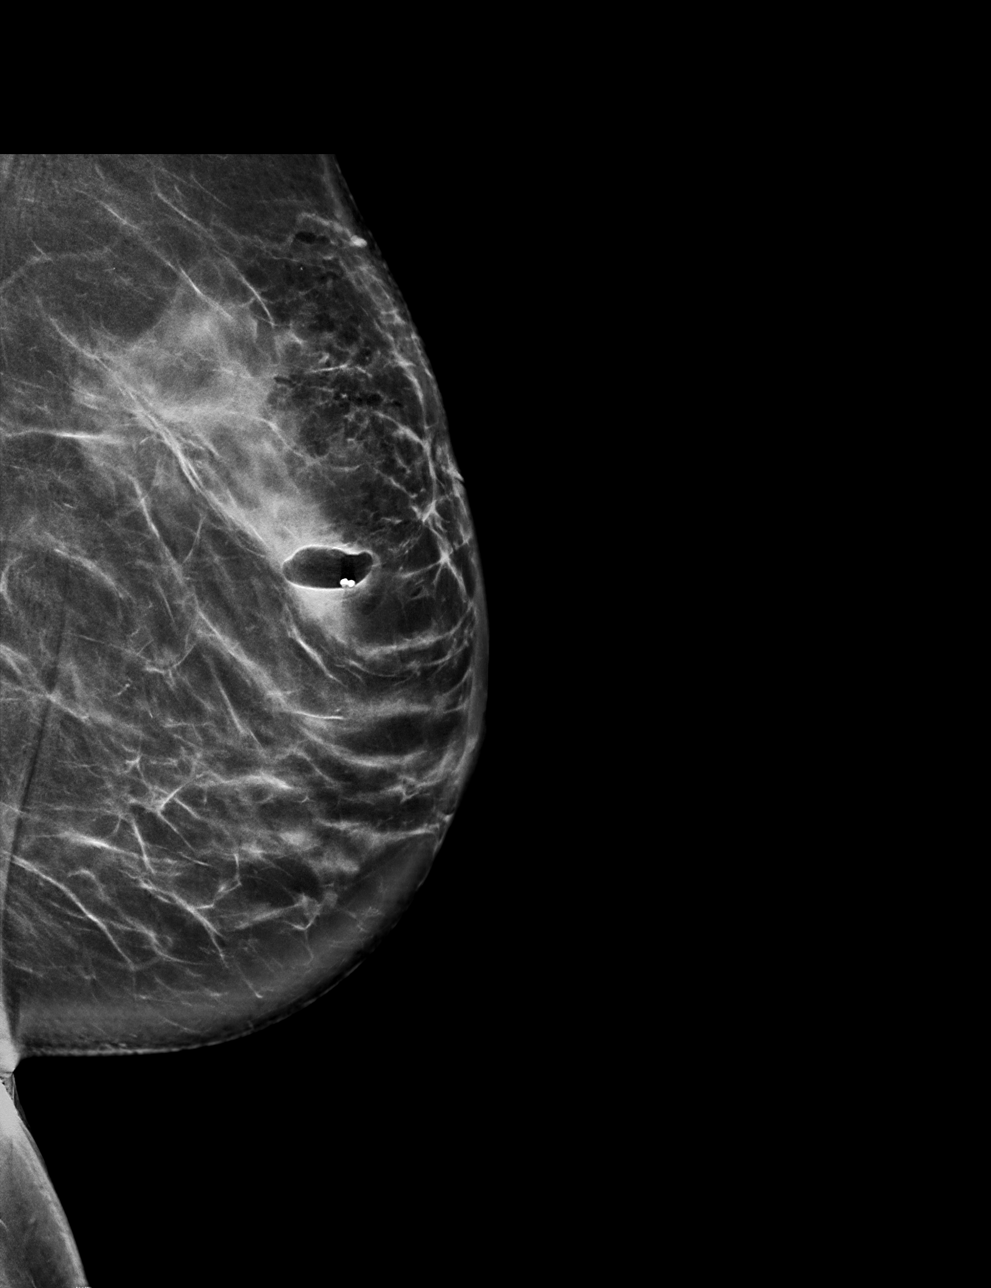

[L CC tomo · tomo slice 43/84.0]
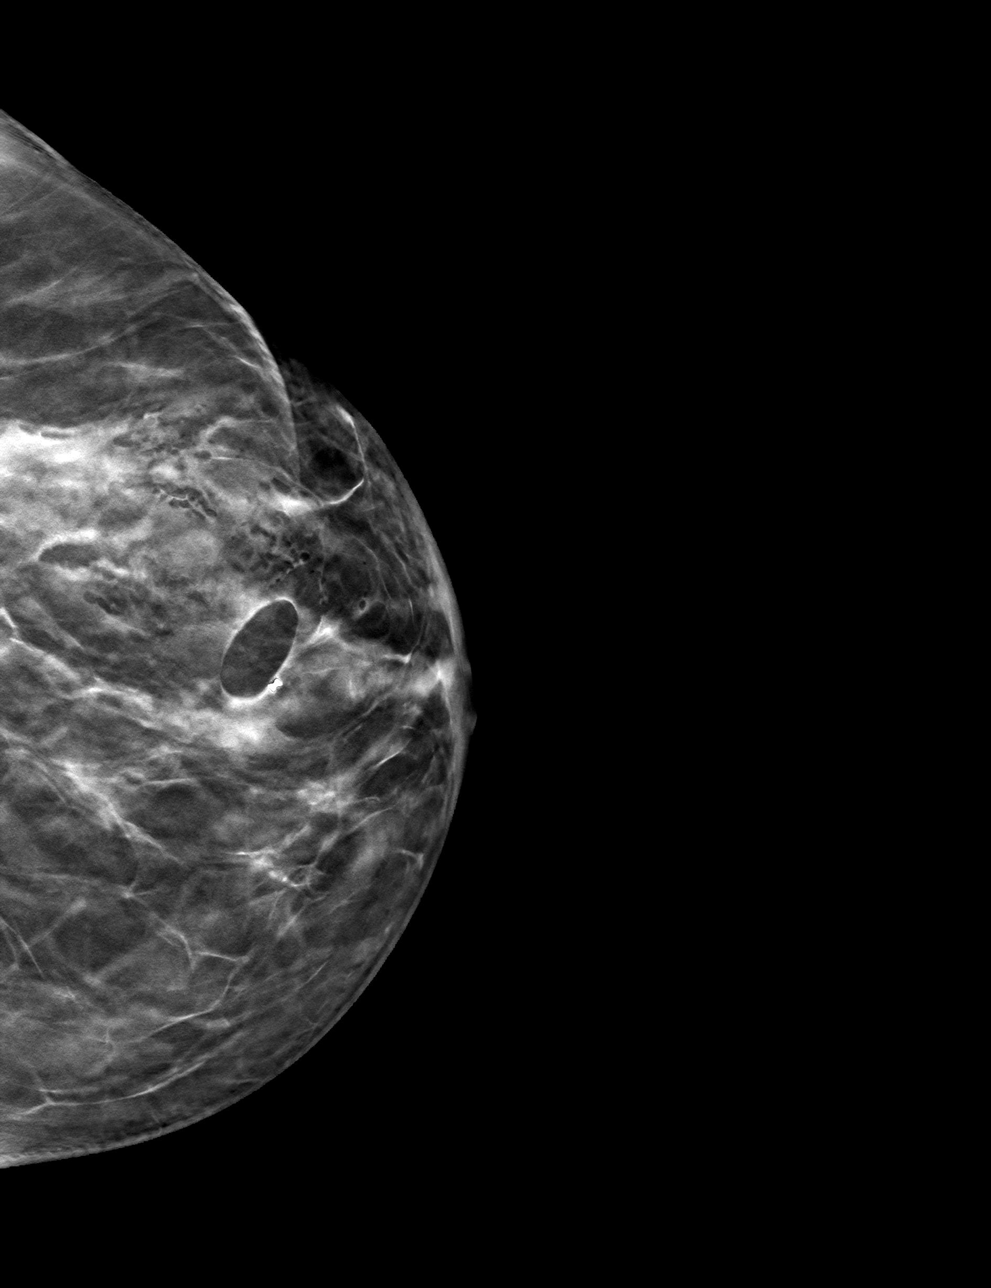

[L ML tomo · tomo slice 49/96.0]
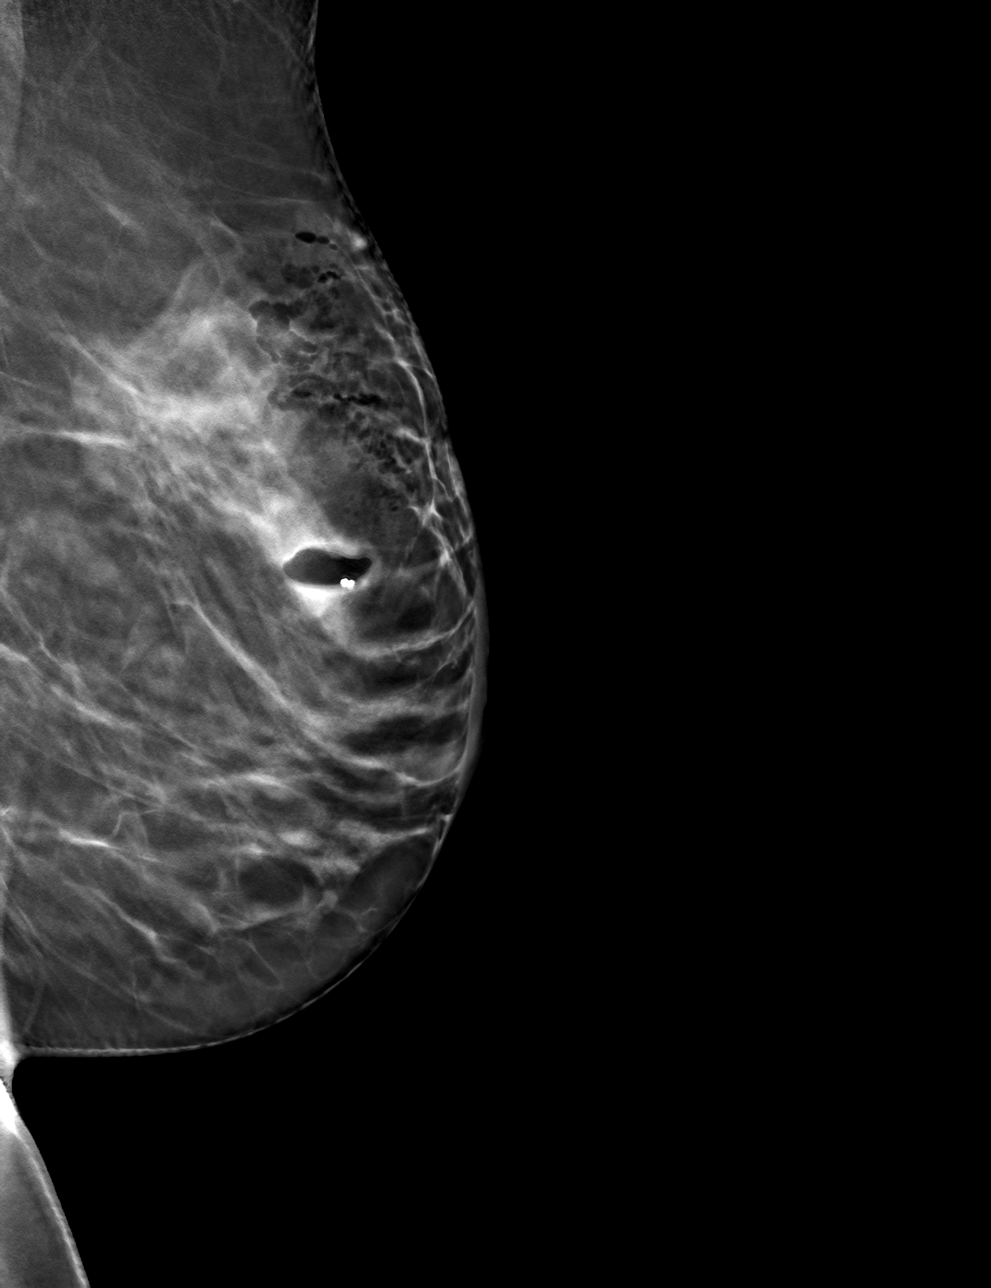

[4 of 12 positions shown; findings below may reference images not displayed]

FINDINGS: Mammographic images were obtained following MRI guided biopsy of
enhancing mass in the anterior UPPER INNER QUADRANT of the LEFT
breast and placement of a dumbbell-shaped clip. The biopsy marking
clip is 1 centimeter LATERAL to the biopsy target.
IMPRESSION: Positioning of the dumbbell-shaped shaped biopsy marking clip is 1
centimeter LATERAL to the site of biopsy in the anterior UPPER INNER
QUADRANT of the LEFT breast.

Final Assessment: Post Procedure Mammograms for Marker Placement

## 2019-06-06 IMAGING — MR MR BREAST BX W LOC DEV 1ST LESION IMAGE BX SPEC MR GUIDE*L*
7 of 10 series · 33 of 48 positions shown · IV contrast (8 ML GADAVIST)
Comparison: [DATE]
COMPARISON: [DATE]

Addendum:
CLINICAL DATA: Patient presents for MR guided core biopsy of mass
in the anterior UPPER INNER QUADRANT of the LEFT breast.

EXAM:
MRI GUIDED CORE NEEDLE BIOPSY OF THE LEFT BREAST
TECHNIQUE: Multiplanar, multisequence MR imaging of the LEFT breast was
performed both before and after administration of intravenous
contrast.
CONTRAST:  8 ml Magnevist

[Series 2: fiducial unilateral · sagittal · 2.0mm · 1.33mm/px · 3 of 52 slices shown]
[im 1/52]
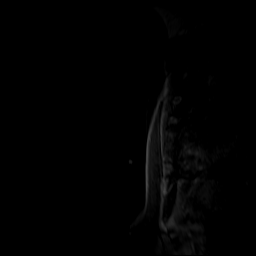
[im 26/52]
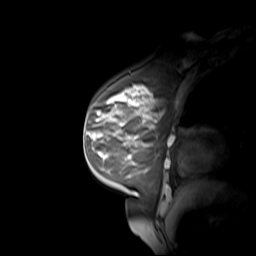
[im 52/52]
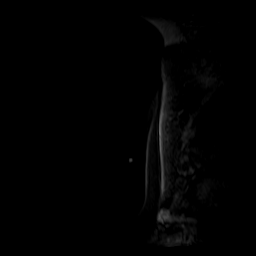

[Series 3: dynamic pre · axial · non-contrast · 1.3mm · 0.73mm/px · z∈[-85,+101]mm · 5 of 144 slices shown]
[im 1/144]
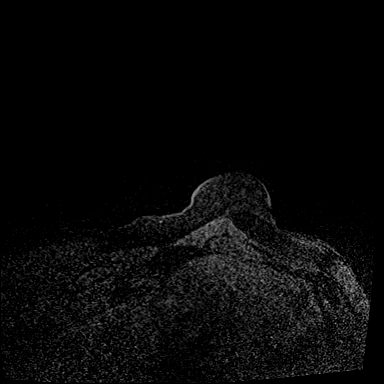
[im 36/144]
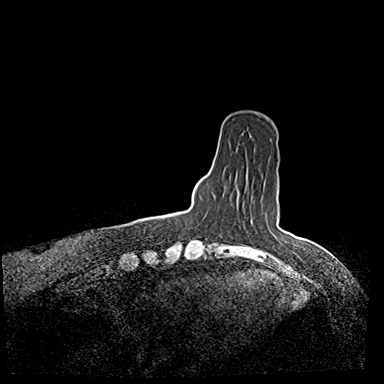
[im 72/144]
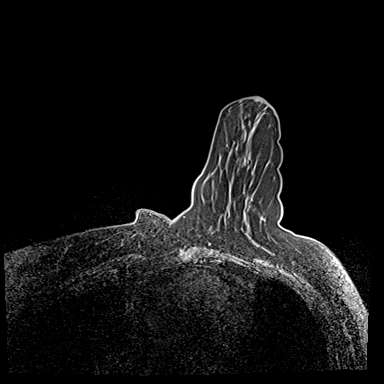
[im 108/144]
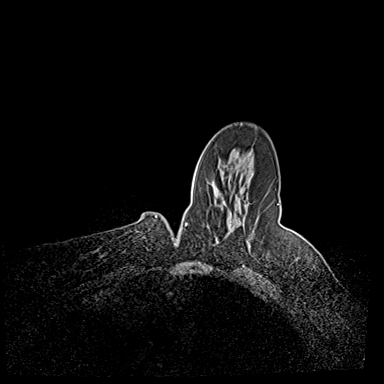
[im 144/144]
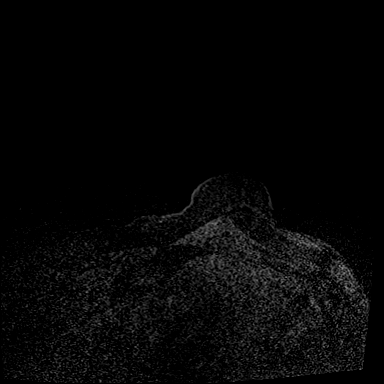

[Series 4: dynamic post 20 · axial · 1.3mm · 0.73mm/px · z∈[-85,+101]mm · 5 of 144 slices shown (1 of 2)]
[im 1/144]
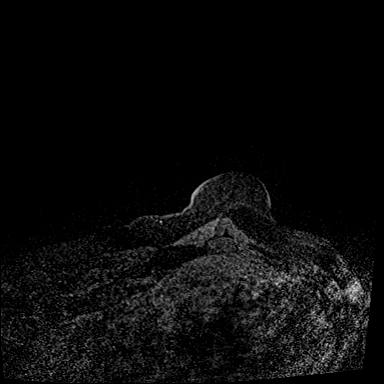
[im 36/144]
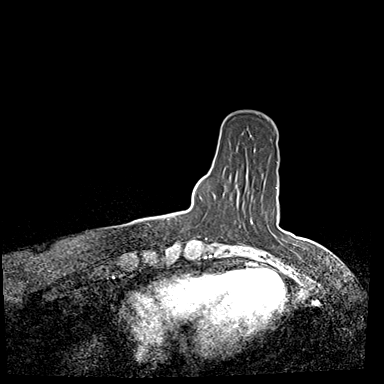
[im 72/144]
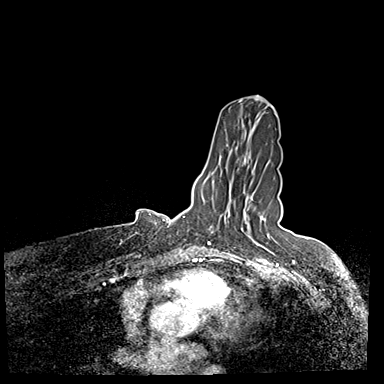
[im 108/144]
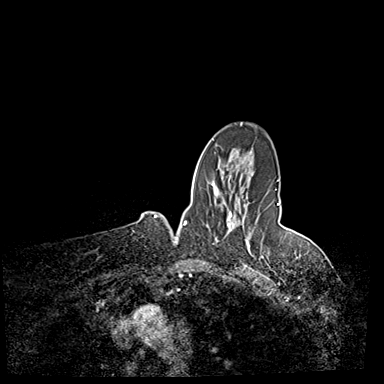
[im 144/144]
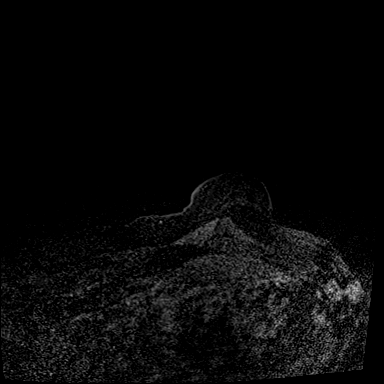

[Series 5: dynamic post 20 · axial · 1.3mm · 0.73mm/px · z∈[-85,+101]mm · 5 of 144 slices shown (2 of 2)]
[im 1/144]
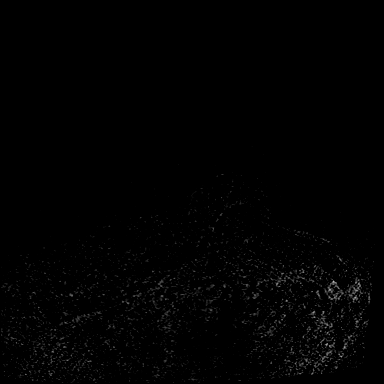
[im 36/144]
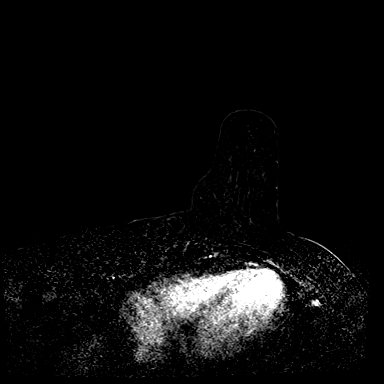
[im 72/144]
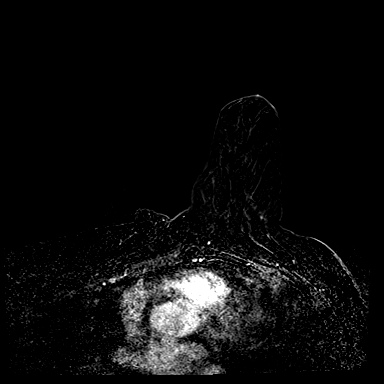
[im 108/144]
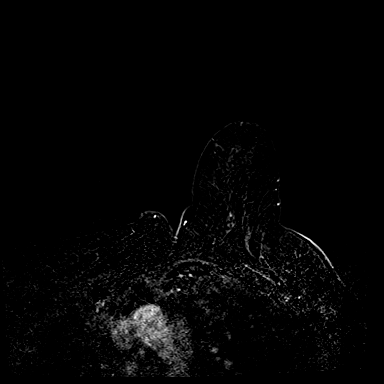
[im 144/144]
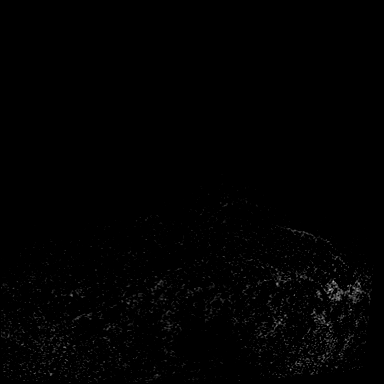

[Series 6: dynamic post 3 · axial · 1.3mm · 0.73mm/px · z∈[-85,+101]mm · 5 of 144 slices shown (1 of 2)]
[im 1/144]
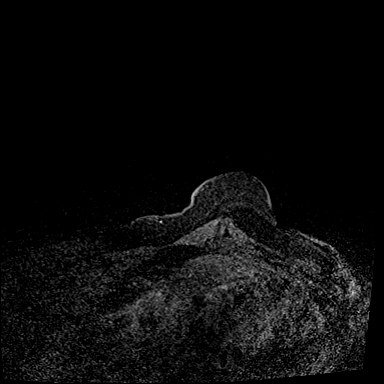
[im 36/144]
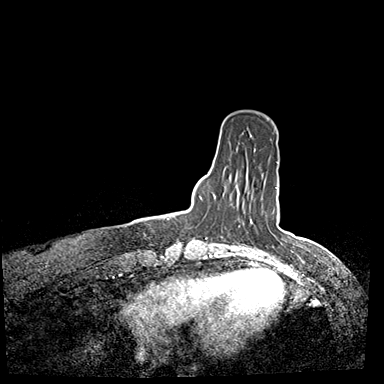
[im 72/144]
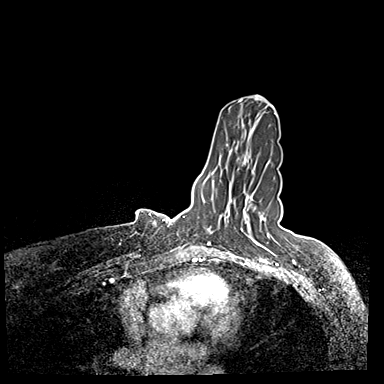
[im 108/144]
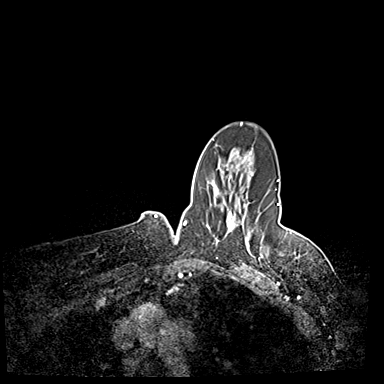
[im 144/144]
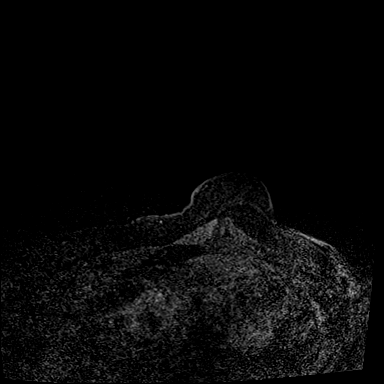

[Series 7: dynamic post 3 · axial · 1.3mm · 0.73mm/px · z∈[-85,+101]mm · 5 of 144 slices shown (2 of 2)]
[im 1/144]
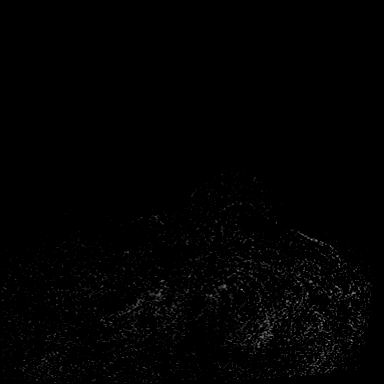
[im 36/144]
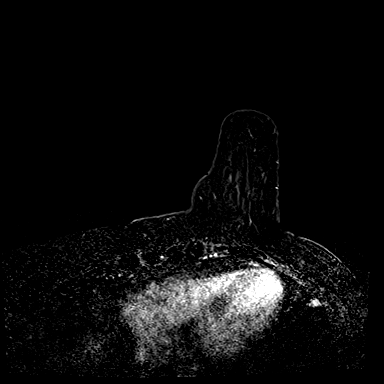
[im 72/144]
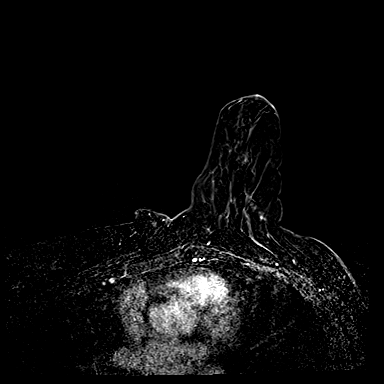
[im 108/144]
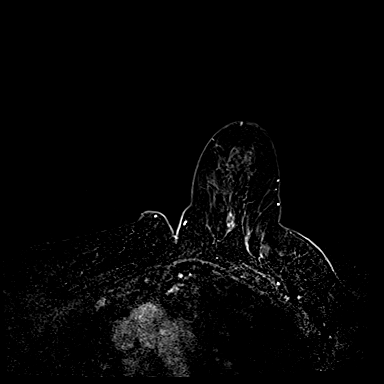
[im 144/144]
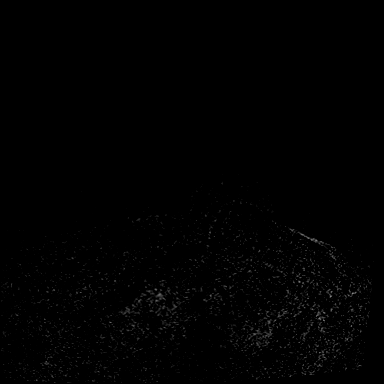

[Series 8: needle confirmation · axial · 1.3mm · 0.73mm/px · z∈[-85,+101]mm · 5 of 144 slices shown]
[im 1/144]
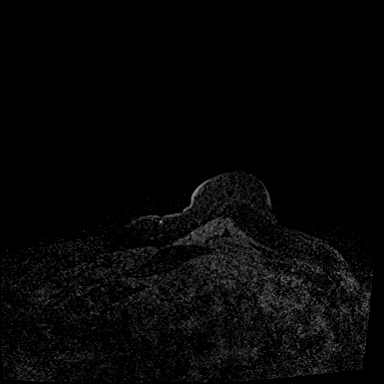
[im 36/144]
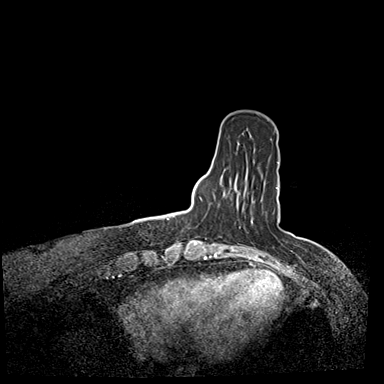
[im 72/144]
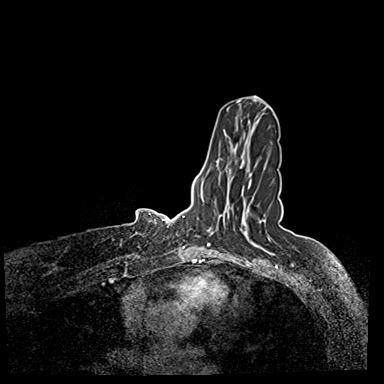
[im 108/144]
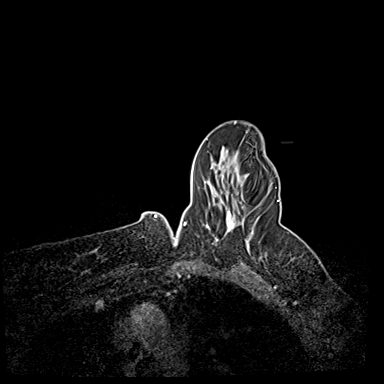
[im 144/144]
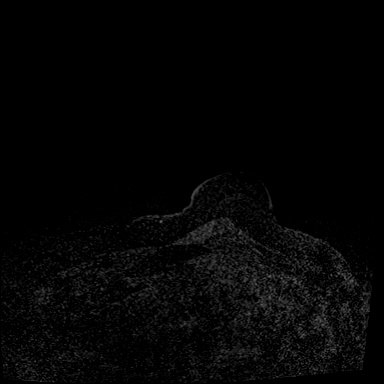

[33 of 48 positions shown; findings below may reference images not displayed]

FINDINGS: I met with the patient, and we discussed the procedure of MRI guided
biopsy, including risks, benefits, and alternatives. Specifically,
we discussed the risks of infection, bleeding, tissue injury, clip
migration, and inadequate sampling. Informed, written consent was
given. The usual time out protocol was performed immediately prior
to the procedure.

Using sterile technique, 1% Lidocaine, MRI guidance, and a 9 gauge
vacuum assisted device, biopsy was performed of enhancing mass in
the anterior UPPER OUTER QUADRANT of the LEFT breast using a LATERAL
approach. At the conclusion of the procedure, a dumbbell-shaped
tissue marker clip was deployed into the biopsy cavity. Follow-up
2-view mammogram was performed and dictated separately.
IMPRESSION: MRI guided biopsy of LEFT breast mass.  No apparent complications.

ADDENDUM:
Pathology revealed FIBROADENOMA of the LEFT breast, anterior upper
inner quadrant, barbell clip. This was found to be concordant by Dr.
BOYADJIAN.

Pathology results were discussed with the patient by telephone. The
patient reported doing well after the biopsy with tenderness at the
site. Post biopsy instructions and care were reviewed and questions
were answered. The patient was encouraged to call The [REDACTED]

The patient was instructed to return for annual screening
mammography in [DATE] and bilateral breast MRI in 6 months per
protocol. The patient was informed a reminder notice would be sent
regarding this appointment.

Pathology results reported by BOYADJIAN, RN on [DATE].

*** End of Addendum ***
FINDINGS: I met with the patient, and we discussed the procedure of MRI guided
biopsy, including risks, benefits, and alternatives. Specifically,
we discussed the risks of infection, bleeding, tissue injury, clip
migration, and inadequate sampling. Informed, written consent was
given. The usual time out protocol was performed immediately prior
to the procedure.

Using sterile technique, 1% Lidocaine, MRI guidance, and a 9 gauge
vacuum assisted device, biopsy was performed of enhancing mass in
the anterior UPPER OUTER QUADRANT of the LEFT breast using a LATERAL
approach. At the conclusion of the procedure, a dumbbell-shaped
tissue marker clip was deployed into the biopsy cavity. Follow-up
2-view mammogram was performed and dictated separately.
IMPRESSION: MRI guided biopsy of LEFT breast mass.  No apparent complications.

## 2019-06-06 MED ORDER — GADOBUTROL 1 MMOL/ML IV SOLN
8.0000 mL | Freq: Once | INTRAVENOUS | Status: AC | PRN
  Administered 2019-06-06: 8 mL via INTRAVENOUS

## 2019-06-07 DIAGNOSIS — D519 Vitamin B12 deficiency anemia, unspecified: Secondary | ICD-10-CM | POA: Diagnosis not present

## 2019-06-12 DIAGNOSIS — Z0000 Encounter for general adult medical examination without abnormal findings: Secondary | ICD-10-CM | POA: Diagnosis not present

## 2019-06-12 DIAGNOSIS — E7849 Other hyperlipidemia: Secondary | ICD-10-CM | POA: Diagnosis not present

## 2019-06-12 DIAGNOSIS — D519 Vitamin B12 deficiency anemia, unspecified: Secondary | ICD-10-CM | POA: Diagnosis not present

## 2019-06-14 DIAGNOSIS — F4323 Adjustment disorder with mixed anxiety and depressed mood: Secondary | ICD-10-CM | POA: Diagnosis not present

## 2019-06-19 DIAGNOSIS — K589 Irritable bowel syndrome without diarrhea: Secondary | ICD-10-CM | POA: Diagnosis not present

## 2019-06-19 DIAGNOSIS — Z0000 Encounter for general adult medical examination without abnormal findings: Secondary | ICD-10-CM | POA: Diagnosis not present

## 2019-06-19 DIAGNOSIS — D510 Vitamin B12 deficiency anemia due to intrinsic factor deficiency: Secondary | ICD-10-CM | POA: Diagnosis not present

## 2019-06-19 DIAGNOSIS — E785 Hyperlipidemia, unspecified: Secondary | ICD-10-CM | POA: Diagnosis not present

## 2019-06-19 DIAGNOSIS — Z1331 Encounter for screening for depression: Secondary | ICD-10-CM | POA: Diagnosis not present

## 2019-06-19 DIAGNOSIS — J45909 Unspecified asthma, uncomplicated: Secondary | ICD-10-CM | POA: Diagnosis not present

## 2019-07-05 DIAGNOSIS — F4323 Adjustment disorder with mixed anxiety and depressed mood: Secondary | ICD-10-CM | POA: Diagnosis not present

## 2019-07-12 DIAGNOSIS — D519 Vitamin B12 deficiency anemia, unspecified: Secondary | ICD-10-CM | POA: Diagnosis not present

## 2019-07-19 DIAGNOSIS — F4323 Adjustment disorder with mixed anxiety and depressed mood: Secondary | ICD-10-CM | POA: Diagnosis not present

## 2019-08-02 DIAGNOSIS — F4323 Adjustment disorder with mixed anxiety and depressed mood: Secondary | ICD-10-CM | POA: Diagnosis not present

## 2019-08-09 DIAGNOSIS — F4323 Adjustment disorder with mixed anxiety and depressed mood: Secondary | ICD-10-CM | POA: Diagnosis not present

## 2019-08-15 DIAGNOSIS — D519 Vitamin B12 deficiency anemia, unspecified: Secondary | ICD-10-CM | POA: Diagnosis not present

## 2019-08-16 DIAGNOSIS — F4323 Adjustment disorder with mixed anxiety and depressed mood: Secondary | ICD-10-CM | POA: Diagnosis not present

## 2019-08-23 DIAGNOSIS — F4323 Adjustment disorder with mixed anxiety and depressed mood: Secondary | ICD-10-CM | POA: Diagnosis not present

## 2019-09-01 DIAGNOSIS — Z20828 Contact with and (suspected) exposure to other viral communicable diseases: Secondary | ICD-10-CM | POA: Diagnosis not present

## 2019-09-04 DIAGNOSIS — J45909 Unspecified asthma, uncomplicated: Secondary | ICD-10-CM | POA: Diagnosis not present

## 2019-09-04 DIAGNOSIS — Z20822 Contact with and (suspected) exposure to covid-19: Secondary | ICD-10-CM | POA: Diagnosis not present

## 2019-09-04 DIAGNOSIS — R05 Cough: Secondary | ICD-10-CM | POA: Diagnosis not present

## 2019-09-04 DIAGNOSIS — J329 Chronic sinusitis, unspecified: Secondary | ICD-10-CM | POA: Diagnosis not present

## 2019-09-06 DIAGNOSIS — F4323 Adjustment disorder with mixed anxiety and depressed mood: Secondary | ICD-10-CM | POA: Diagnosis not present

## 2019-09-28 DIAGNOSIS — R3915 Urgency of urination: Secondary | ICD-10-CM | POA: Diagnosis not present

## 2019-09-28 DIAGNOSIS — R350 Frequency of micturition: Secondary | ICD-10-CM | POA: Diagnosis not present

## 2019-09-28 DIAGNOSIS — D519 Vitamin B12 deficiency anemia, unspecified: Secondary | ICD-10-CM | POA: Diagnosis not present

## 2019-10-25 DIAGNOSIS — L820 Inflamed seborrheic keratosis: Secondary | ICD-10-CM | POA: Diagnosis not present

## 2019-10-25 DIAGNOSIS — L821 Other seborrheic keratosis: Secondary | ICD-10-CM | POA: Diagnosis not present

## 2019-10-25 DIAGNOSIS — L578 Other skin changes due to chronic exposure to nonionizing radiation: Secondary | ICD-10-CM | POA: Diagnosis not present

## 2019-10-25 DIAGNOSIS — D485 Neoplasm of uncertain behavior of skin: Secondary | ICD-10-CM | POA: Diagnosis not present

## 2019-10-25 DIAGNOSIS — R350 Frequency of micturition: Secondary | ICD-10-CM | POA: Diagnosis not present

## 2019-10-25 DIAGNOSIS — D225 Melanocytic nevi of trunk: Secondary | ICD-10-CM | POA: Diagnosis not present

## 2019-10-30 DIAGNOSIS — F331 Major depressive disorder, recurrent, moderate: Secondary | ICD-10-CM | POA: Diagnosis not present

## 2019-11-14 DIAGNOSIS — D519 Vitamin B12 deficiency anemia, unspecified: Secondary | ICD-10-CM | POA: Diagnosis not present

## 2019-12-06 DIAGNOSIS — D485 Neoplasm of uncertain behavior of skin: Secondary | ICD-10-CM | POA: Diagnosis not present

## 2019-12-29 DIAGNOSIS — R3915 Urgency of urination: Secondary | ICD-10-CM | POA: Diagnosis not present

## 2019-12-29 DIAGNOSIS — R350 Frequency of micturition: Secondary | ICD-10-CM | POA: Diagnosis not present

## 2020-01-17 DIAGNOSIS — D510 Vitamin B12 deficiency anemia due to intrinsic factor deficiency: Secondary | ICD-10-CM | POA: Diagnosis not present

## 2020-01-17 DIAGNOSIS — E669 Obesity, unspecified: Secondary | ICD-10-CM | POA: Diagnosis not present

## 2020-01-17 DIAGNOSIS — F17200 Nicotine dependence, unspecified, uncomplicated: Secondary | ICD-10-CM | POA: Diagnosis not present

## 2020-01-17 DIAGNOSIS — E7849 Other hyperlipidemia: Secondary | ICD-10-CM | POA: Diagnosis not present

## 2020-02-13 DIAGNOSIS — Z6830 Body mass index (BMI) 30.0-30.9, adult: Secondary | ICD-10-CM | POA: Diagnosis not present

## 2020-02-13 DIAGNOSIS — Z01419 Encounter for gynecological examination (general) (routine) without abnormal findings: Secondary | ICD-10-CM | POA: Diagnosis not present

## 2020-02-13 DIAGNOSIS — Z1231 Encounter for screening mammogram for malignant neoplasm of breast: Secondary | ICD-10-CM | POA: Diagnosis not present

## 2020-02-13 DIAGNOSIS — R519 Headache, unspecified: Secondary | ICD-10-CM | POA: Diagnosis not present

## 2020-02-13 DIAGNOSIS — R6882 Decreased libido: Secondary | ICD-10-CM | POA: Diagnosis not present

## 2020-02-26 ENCOUNTER — Other Ambulatory Visit: Payer: Self-pay | Admitting: Obstetrics and Gynecology

## 2020-02-26 DIAGNOSIS — Z803 Family history of malignant neoplasm of breast: Secondary | ICD-10-CM

## 2020-03-14 DIAGNOSIS — R3915 Urgency of urination: Secondary | ICD-10-CM | POA: Diagnosis not present

## 2020-03-14 DIAGNOSIS — R350 Frequency of micturition: Secondary | ICD-10-CM | POA: Diagnosis not present

## 2020-04-16 DIAGNOSIS — R350 Frequency of micturition: Secondary | ICD-10-CM | POA: Diagnosis not present

## 2020-04-16 DIAGNOSIS — R3915 Urgency of urination: Secondary | ICD-10-CM | POA: Diagnosis not present

## 2020-04-24 DIAGNOSIS — F900 Attention-deficit hyperactivity disorder, predominantly inattentive type: Secondary | ICD-10-CM | POA: Diagnosis not present

## 2020-04-24 DIAGNOSIS — F410 Panic disorder [episodic paroxysmal anxiety] without agoraphobia: Secondary | ICD-10-CM | POA: Diagnosis not present

## 2020-04-24 DIAGNOSIS — F3342 Major depressive disorder, recurrent, in full remission: Secondary | ICD-10-CM | POA: Diagnosis not present

## 2020-05-13 DIAGNOSIS — H93292 Other abnormal auditory perceptions, left ear: Secondary | ICD-10-CM | POA: Diagnosis not present

## 2020-05-13 DIAGNOSIS — M2669 Other specified disorders of temporomandibular joint: Secondary | ICD-10-CM | POA: Diagnosis not present

## 2020-05-13 DIAGNOSIS — H9202 Otalgia, left ear: Secondary | ICD-10-CM | POA: Diagnosis not present

## 2020-05-13 DIAGNOSIS — J302 Other seasonal allergic rhinitis: Secondary | ICD-10-CM | POA: Diagnosis not present

## 2020-05-17 DIAGNOSIS — D225 Melanocytic nevi of trunk: Secondary | ICD-10-CM | POA: Diagnosis not present

## 2020-05-17 DIAGNOSIS — Z23 Encounter for immunization: Secondary | ICD-10-CM | POA: Diagnosis not present

## 2020-05-17 DIAGNOSIS — L578 Other skin changes due to chronic exposure to nonionizing radiation: Secondary | ICD-10-CM | POA: Diagnosis not present

## 2020-05-17 DIAGNOSIS — L821 Other seborrheic keratosis: Secondary | ICD-10-CM | POA: Diagnosis not present

## 2020-06-11 DIAGNOSIS — R3915 Urgency of urination: Secondary | ICD-10-CM | POA: Diagnosis not present

## 2020-06-11 DIAGNOSIS — R350 Frequency of micturition: Secondary | ICD-10-CM | POA: Diagnosis not present

## 2020-06-11 DIAGNOSIS — D510 Vitamin B12 deficiency anemia due to intrinsic factor deficiency: Secondary | ICD-10-CM | POA: Diagnosis not present

## 2020-06-14 DIAGNOSIS — M545 Low back pain, unspecified: Secondary | ICD-10-CM | POA: Diagnosis not present

## 2020-06-14 DIAGNOSIS — M5416 Radiculopathy, lumbar region: Secondary | ICD-10-CM | POA: Diagnosis not present

## 2020-06-18 DIAGNOSIS — D510 Vitamin B12 deficiency anemia due to intrinsic factor deficiency: Secondary | ICD-10-CM | POA: Diagnosis not present

## 2020-06-18 DIAGNOSIS — Z0000 Encounter for general adult medical examination without abnormal findings: Secondary | ICD-10-CM | POA: Diagnosis not present

## 2020-06-18 DIAGNOSIS — E785 Hyperlipidemia, unspecified: Secondary | ICD-10-CM | POA: Diagnosis not present

## 2020-06-20 DIAGNOSIS — R82998 Other abnormal findings in urine: Secondary | ICD-10-CM | POA: Diagnosis not present

## 2020-06-20 DIAGNOSIS — Z0000 Encounter for general adult medical examination without abnormal findings: Secondary | ICD-10-CM | POA: Diagnosis not present

## 2020-06-20 DIAGNOSIS — E785 Hyperlipidemia, unspecified: Secondary | ICD-10-CM | POA: Diagnosis not present

## 2020-06-22 ENCOUNTER — Encounter (HOSPITAL_BASED_OUTPATIENT_CLINIC_OR_DEPARTMENT_OTHER): Payer: Self-pay | Admitting: Emergency Medicine

## 2020-06-22 ENCOUNTER — Other Ambulatory Visit: Payer: Self-pay

## 2020-06-22 ENCOUNTER — Emergency Department (HOSPITAL_BASED_OUTPATIENT_CLINIC_OR_DEPARTMENT_OTHER)
Admission: EM | Admit: 2020-06-22 | Discharge: 2020-06-22 | Disposition: A | Discharge status: Home / Self Care | Payer: BC Managed Care – PPO | Attending: Emergency Medicine | Admitting: Emergency Medicine

## 2020-06-22 DIAGNOSIS — M25552 Pain in left hip: Secondary | ICD-10-CM | POA: Diagnosis not present

## 2020-06-22 DIAGNOSIS — F17200 Nicotine dependence, unspecified, uncomplicated: Secondary | ICD-10-CM | POA: Insufficient documentation

## 2020-06-22 DIAGNOSIS — W010XXA Fall on same level from slipping, tripping and stumbling without subsequent striking against object, initial encounter: Secondary | ICD-10-CM | POA: Insufficient documentation

## 2020-06-22 HISTORY — DX: Depression, unspecified: F32.A

## 2020-06-22 HISTORY — DX: Gastro-esophageal reflux disease without esophagitis: K21.9

## 2020-06-22 HISTORY — DX: Anxiety disorder, unspecified: F41.9

## 2020-06-22 MED ORDER — ONDANSETRON 4 MG PO TBDP
8.0000 mg | ORAL_TABLET | Freq: Once | ORAL | Status: AC
  Administered 2020-06-22: 8 mg via ORAL
  Filled 2020-06-22: qty 2

## 2020-06-22 MED ORDER — ONDANSETRON 4 MG PO TBDP: ORAL_TABLET | ORAL | 0 refills | Status: DC

## 2020-06-22 MED ORDER — HYDROCODONE-ACETAMINOPHEN 5-325 MG PO TABS: 2.0000 | ORAL_TABLET | Freq: Four times a day (QID) | ORAL | 0 refills | Status: DC | PRN

## 2020-06-22 MED ORDER — MORPHINE SULFATE (PF) 4 MG/ML IV SOLN
6.0000 mg | Freq: Once | INTRAVENOUS | Status: AC
  Administered 2020-06-22: 6 mg via INTRAMUSCULAR
  Filled 2020-06-22: qty 2

## 2020-06-22 MED ORDER — DEXAMETHASONE SODIUM PHOSPHATE 10 MG/ML IJ SOLN
10.0000 mg | Freq: Once | INTRAMUSCULAR | Status: AC
  Administered 2020-06-22: 10 mg via INTRAMUSCULAR
  Filled 2020-06-22: qty 1

## 2020-06-22 NOTE — ED Notes (Signed)
She is able to (with a limp) capably ambulate to b.r. and back. Her daughter remains with her.

## 2020-06-22 NOTE — ED Provider Notes (Signed)
[facility] EMERGENCY DEPARTMENT Provider Note   CSN: [id number] Arrival date & time: 06/22/20  1542     History Chief Complaint  Patient presents with  . Hip Pain    April Hardy is a 49 y.o. female.  Patient is a 49 year old female who presents with left hip pain.  She says for about the last year she has had intermittent pain in her left hip.  However a month ago she slipped on a dog bone and fell onto her left side.  She has been complaining of some worsening pain to her left hip since that time.  She was seen about a week and half ago at an orthopedic urgent care at [facility] and given a prescription for prednisone pack and gabapentin.  She says that initially her symptoms had improved significantly with this treatment.  However today she woke up with some worsening pain.  Its in the same area.  Its in her left lateral hip.  There are some radiation to her thigh.  No further radiation down her leg.  No associated back pain.  She denies any numbness or weakness to her leg.  No other recent injuries.  She does report that she had x-rays of the hip at the visit with urgent care.  She has an appointment in 2 days to follow back up with the orthopedist.  She says that the pain was worse today and she came in here for treatment.        Past Medical History:  Diagnosis Date  . Anxiety and depression   . GERD (gastroesophageal reflux disease)     There are no problems to display for this patient.   Past Surgical History:  Procedure Laterality Date  . ABDOMINAL HYSTERECTOMY    . CESAREAN SECTION    . CHOLECYSTECTOMY    . NASAL SINUS SURGERY       OB History   No obstetric history on file.     No family history on file.  Social History   Tobacco Use  . Smoking status: Current Every Day Smoker  . Smokeless tobacco: Never Used  Substance Use Topics  . Alcohol use: Never  . Drug use: Never    Home Medications Prior to Admission medications     Medication Sig Start Date End Date Taking? Authorizing Provider  HYDROcodone-acetaminophen (NORCO/VICODIN) 5-325 MG tablet Take 2 tablets by mouth every 6 (six) hours as needed. 06/22/20   Rolan Bucco, MD    Allergies    Codeine and Sulfa antibiotics  Review of Systems   Review of Systems  Constitutional: Negative for fever.  Gastrointestinal: Negative for nausea and vomiting.  Musculoskeletal: Positive for arthralgias. Negative for back pain, joint swelling and neck pain.  Skin: Negative for wound.  Neurological: Negative for weakness, numbness and headaches.    Physical Exam Updated Vital Signs BP (!) 106/57 (BP Location: Right Arm)   Pulse 100   Temp 97.9 F (36.6 C) (Oral)   Resp 18   Ht 5\' 2"  (1.575 m)   Wt 72.6 kg   SpO2 100%   BMI 29.26 kg/m   Physical Exam Constitutional:      Appearance: She is well-developed.  HENT:     Head: Normocephalic and atraumatic.  Cardiovascular:     Rate and Rhythm: Normal rate.  Pulmonary:     Effort: Pulmonary effort is normal.  Musculoskeletal:        General: Tenderness present.     Cervical back: Normal  range of motion and neck supple.     Comments: Patient has tenderness to the lateral aspect of the left hip over the bursa area.  There is some pain over the posterior hip/sciatic nerve area.  There is no pain along the lumbar spine or sacrum.  No significant pain on range of motion of the hip joint.  Pedal pulses are intact.  She has normal sensation and motor function distally.  No warmth or erythema is noted.  Skin:    General: Skin is warm and dry.  Neurological:     Mental Status: She is alert and oriented to person, place, and time.     ED Results / Procedures / Treatments   Labs (all labs ordered are listed, but only abnormal results are displayed) Labs Reviewed - No data to display  EKG None  Radiology No results found.  Procedures Procedures (including critical care time)  Medications Ordered in  ED Medications  dexamethasone (DECADRON) injection 10 mg (10 mg Intramuscular Given 06/22/20 1703)  morphine 4 MG/ML injection 6 mg (6 mg Intramuscular Given 06/22/20 1704)  ondansetron (ZOFRAN-ODT) disintegrating tablet 8 mg (8 mg Oral Given 06/22/20 1702)    ED Course  I have reviewed the triage vital signs and the nursing notes.  Pertinent labs & imaging results that were available during my care of the patient were reviewed by me and considered in my medical decision making (see chart for details).    MDM Rules/Calculators/A&P                          Patient presents with left hip pain.  She had x-rays done at the urgent care which were reported to be negative.  I did not feel that this needs to be repeated today given that she has not had any further injury.  She does not have any suggestions of infection.  Her pain seems to be more lateral, potentially bursitis.  She was given a shot of Decadron and morphine in the ED.  She was given a prescription for short course of Vicodin.  She has an appointment in 2 days to follow-up with orthopedist.  Return precautions were given. Final Clinical Impression(s) / ED Diagnoses Final diagnoses:  Left hip pain    Rx / DC Orders ED Discharge Orders         Ordered    HYDROcodone-acetaminophen (NORCO/VICODIN) 5-325 MG tablet  Every 6 hours PRN        06/22/20 1705           Rolan Bucco, MD 06/22/20 1710

## 2020-06-22 NOTE — ED Triage Notes (Signed)
Reports left hip pain for the last month since slipping on a dog bone.  Seen at [facility] and diagnosed with bursitis.  Was given prednisone and gabapentin with little relief.

## 2020-06-24 DIAGNOSIS — M545 Low back pain, unspecified: Secondary | ICD-10-CM | POA: Diagnosis not present

## 2020-06-24 DIAGNOSIS — M7062 Trochanteric bursitis, left hip: Secondary | ICD-10-CM | POA: Diagnosis not present

## 2020-07-05 DIAGNOSIS — D509 Iron deficiency anemia, unspecified: Secondary | ICD-10-CM | POA: Diagnosis not present

## 2020-07-05 DIAGNOSIS — E782 Mixed hyperlipidemia: Secondary | ICD-10-CM | POA: Diagnosis not present

## 2020-07-05 DIAGNOSIS — N951 Menopausal and female climacteric states: Secondary | ICD-10-CM | POA: Diagnosis not present

## 2020-07-05 DIAGNOSIS — E559 Vitamin D deficiency, unspecified: Secondary | ICD-10-CM | POA: Diagnosis not present

## 2020-07-05 DIAGNOSIS — Z6828 Body mass index (BMI) 28.0-28.9, adult: Secondary | ICD-10-CM | POA: Diagnosis not present

## 2020-07-09 DIAGNOSIS — D510 Vitamin B12 deficiency anemia due to intrinsic factor deficiency: Secondary | ICD-10-CM | POA: Diagnosis not present

## 2020-07-10 DIAGNOSIS — Z1339 Encounter for screening examination for other mental health and behavioral disorders: Secondary | ICD-10-CM | POA: Diagnosis not present

## 2020-07-10 DIAGNOSIS — N951 Menopausal and female climacteric states: Secondary | ICD-10-CM | POA: Diagnosis not present

## 2020-07-10 DIAGNOSIS — F330 Major depressive disorder, recurrent, mild: Secondary | ICD-10-CM | POA: Diagnosis not present

## 2020-07-10 DIAGNOSIS — F419 Anxiety disorder, unspecified: Secondary | ICD-10-CM | POA: Diagnosis not present

## 2020-07-10 DIAGNOSIS — Z1331 Encounter for screening for depression: Secondary | ICD-10-CM | POA: Diagnosis not present

## 2020-07-10 DIAGNOSIS — E782 Mixed hyperlipidemia: Secondary | ICD-10-CM | POA: Diagnosis not present

## 2020-07-15 DIAGNOSIS — M25552 Pain in left hip: Secondary | ICD-10-CM | POA: Diagnosis not present

## 2020-08-07 DIAGNOSIS — M25552 Pain in left hip: Secondary | ICD-10-CM | POA: Diagnosis not present

## 2020-08-12 DIAGNOSIS — N951 Menopausal and female climacteric states: Secondary | ICD-10-CM | POA: Diagnosis not present

## 2020-08-13 DIAGNOSIS — M7062 Trochanteric bursitis, left hip: Secondary | ICD-10-CM | POA: Diagnosis not present

## 2020-08-14 DIAGNOSIS — R3915 Urgency of urination: Secondary | ICD-10-CM | POA: Diagnosis not present

## 2020-08-14 DIAGNOSIS — R350 Frequency of micturition: Secondary | ICD-10-CM | POA: Diagnosis not present

## 2021-03-21 ENCOUNTER — Other Ambulatory Visit: Payer: Self-pay | Admitting: Obstetrics and Gynecology

## 2021-03-21 DIAGNOSIS — Z803 Family history of malignant neoplasm of breast: Secondary | ICD-10-CM

## 2021-08-08 ENCOUNTER — Ambulatory Visit
Admission: RE | Admit: 2021-08-08 | Discharge: 2021-08-08 | Disposition: A | Discharge status: Home / Self Care | Payer: BC Managed Care – PPO | Source: Ambulatory Visit | Attending: Obstetrics and Gynecology | Admitting: Obstetrics and Gynecology

## 2021-08-08 ENCOUNTER — Other Ambulatory Visit: Payer: Self-pay

## 2021-08-08 DIAGNOSIS — Z803 Family history of malignant neoplasm of breast: Secondary | ICD-10-CM

## 2021-08-08 IMAGING — MR MR BREAST BILAT WO/W CM
8 of 12 series · 33 of 48 positions shown · IV contrast (6ML GADAVIST)
Comparison: Screening mammogram on [DATE] and prior MRI on
[DATE]

CLINICAL DATA: High risk screening study. History of benign LEFT
breast biopsy in [0V]. Family history of breast cancer diagnosed in
her sister at age 48. patient had MR guided core biopsy of the LEFT
breast in [DATE] showing benign fibroadenoma.

EXAM:
BILATERAL BREAST MRI WITH AND WITHOUT CONTRAST
TECHNIQUE: Multiplanar, multisequence MR images of both breasts were obtained
prior to and following the intravenous administration of 6 ml of
Gadavist

[Series 2: t2_tirm_tra ipat (a-p) · axial · 3.0mm · 0.70mm/px · 1 of 58 slices shown]
[im 1/58]
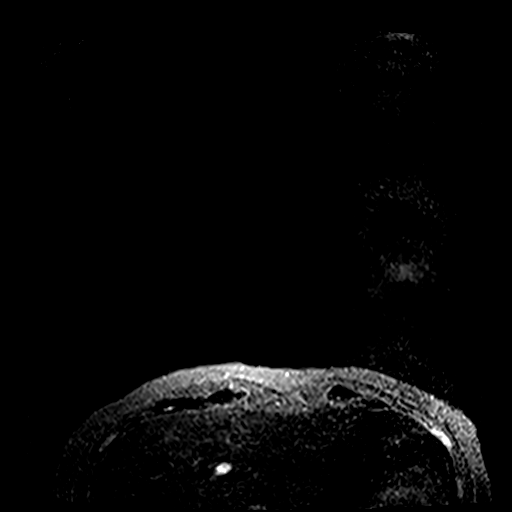

[Series 3: fl3d pre-cm no · axial · non-contrast · 1.2mm · 0.89mm/px · z∈[-79,+112]mm · 5 of 160 slices shown]
[im 1/160]
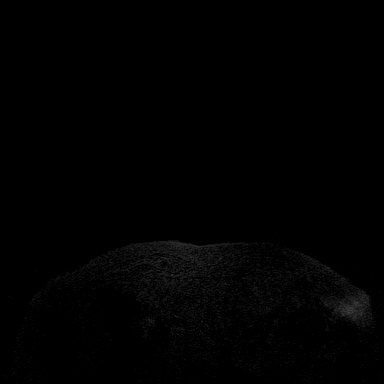
[im 40/160]
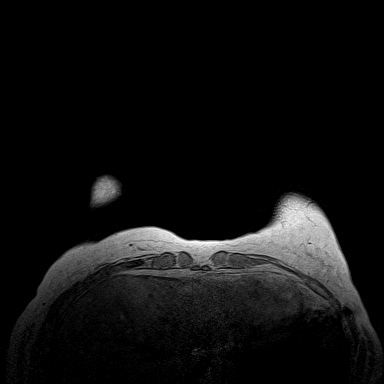
[im 80/160]
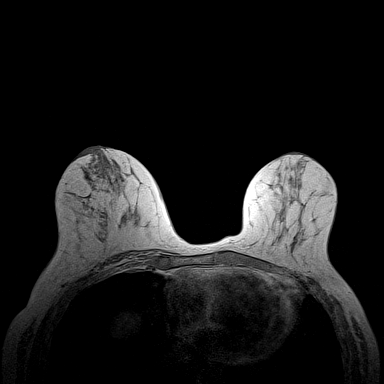
[im 120/160]
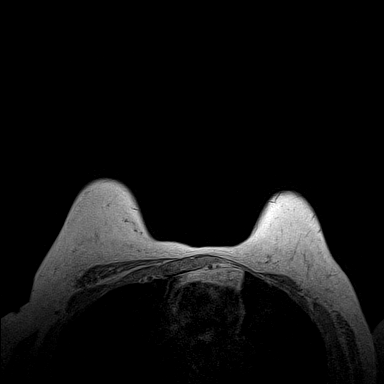
[im 160/160]
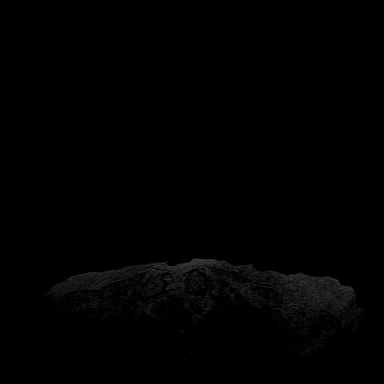

[Series 4: fl3d pre-cm · axial · non-contrast · 1.2mm · 0.89mm/px · z∈[-65,+107]mm · 5 of 144 slices shown]
[im 1/144]
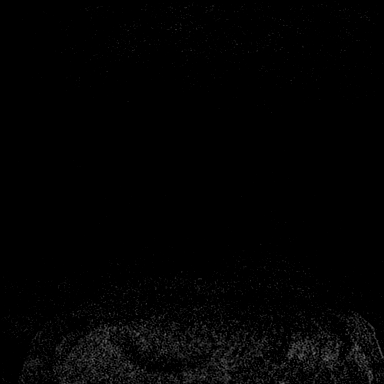
[im 36/144]
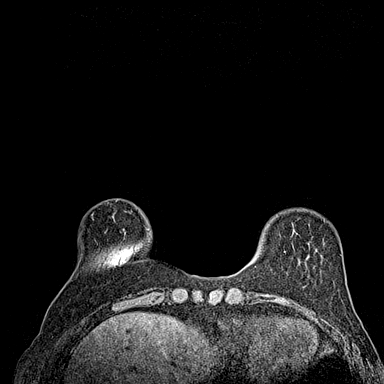
[im 72/144]
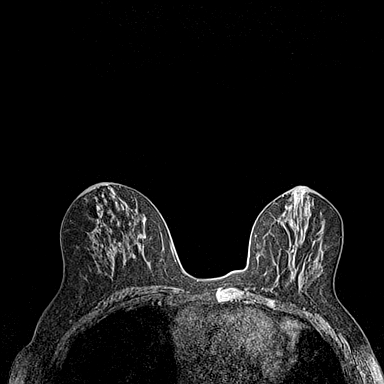
[im 108/144]
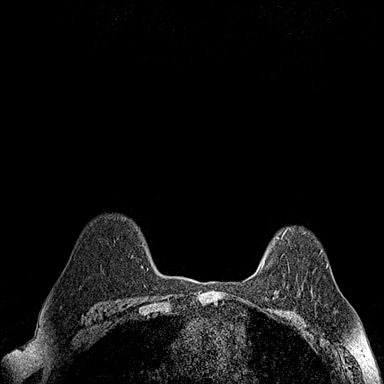
[im 144/144]
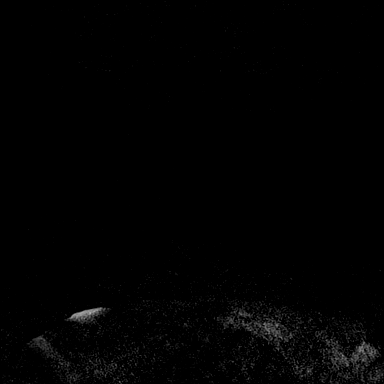

[Series 5: fl3d post-cm 20 · axial · 1.2mm · 0.89mm/px · z∈[-65,+107]mm · 5 of 144 slices shown (1 of 3)]
[im 1/144]
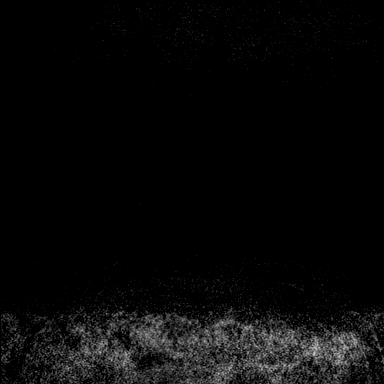
[im 36/144]
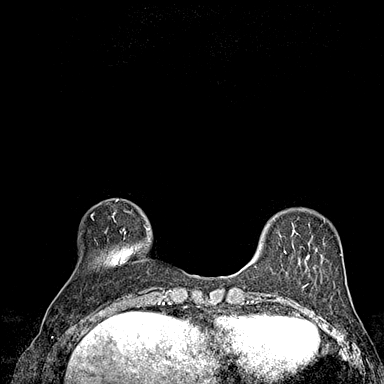
[im 72/144]
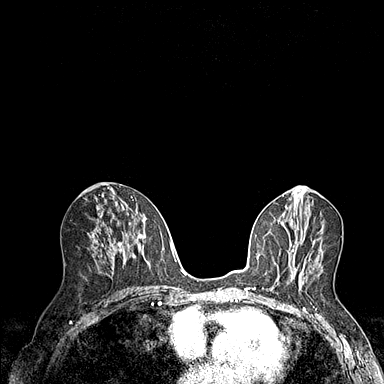
[im 108/144]
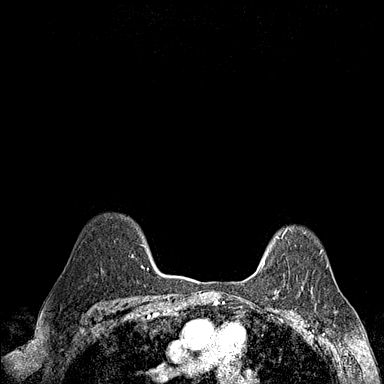
[im 144/144]
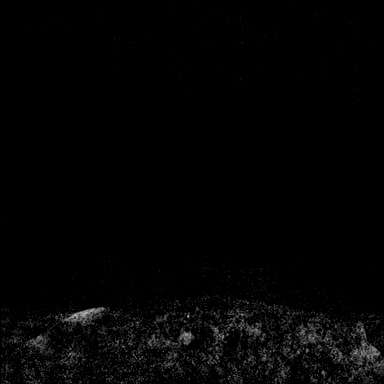

[Series 6: fl3d post-cm 20 · axial · 1.2mm · 0.89mm/px · z∈[-65,+107]mm · 5 of 144 slices shown (2 of 3)]
[im 1/144]
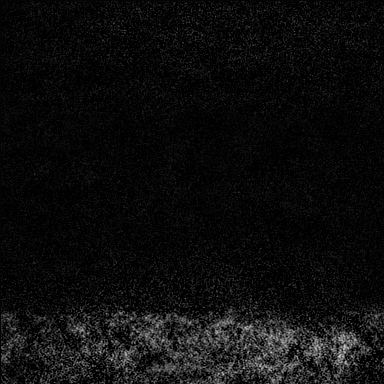
[im 36/144]
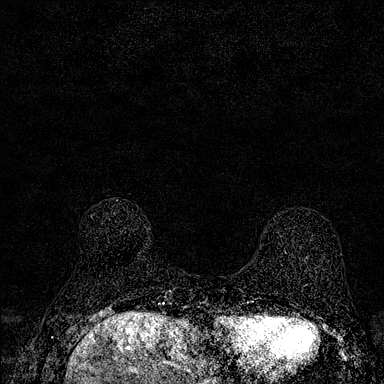
[im 72/144]
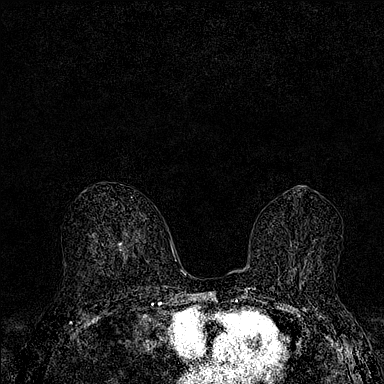
[im 108/144]
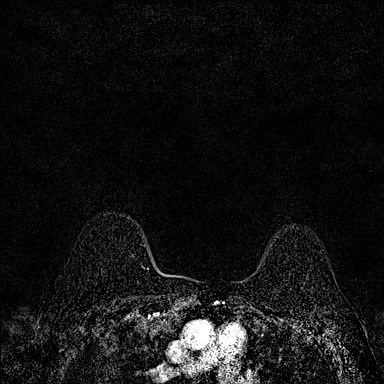
[im 144/144]
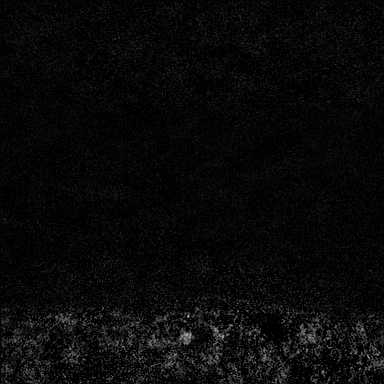

[Series 7: fl3d post-cm 20 · axial · 172.8mm · 0.89mm/px · 1 of 1 slices shown (3 of 3)]
[im 1/1]
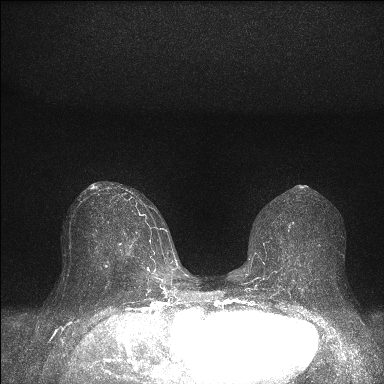

[Series 8: fl3d post-cm 3 · axial · 1.2mm · 0.89mm/px · z∈[-65,+107]mm · 6 of 144 slices shown (1 of 2)]
[im 1/144]
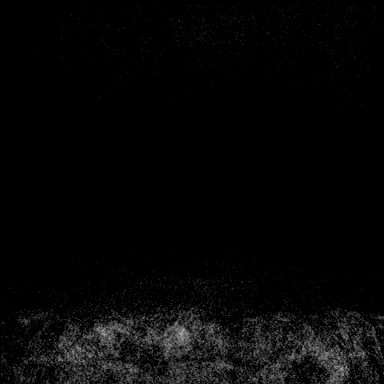
[im 29/144]
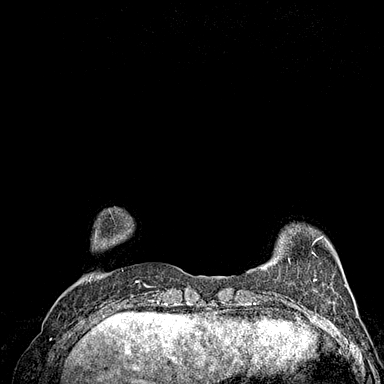
[im 58/144]
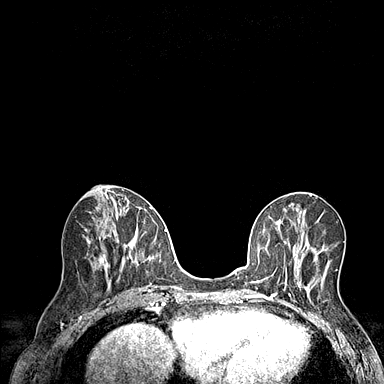
[im 86/144]
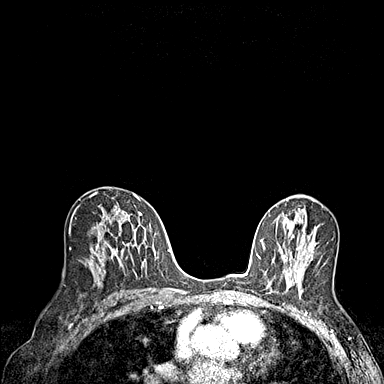
[im 115/144]
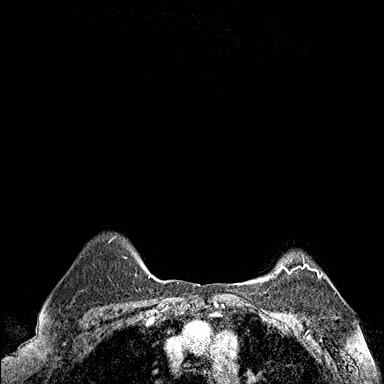
[im 144/144]
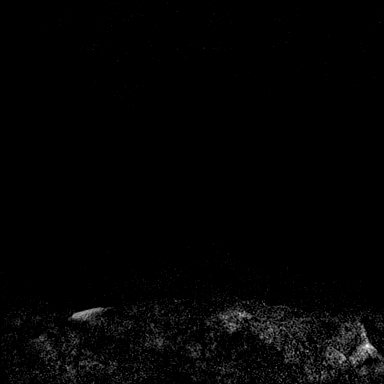

[Series 9: fl3d post-cm 3 · axial · 1.2mm · 0.89mm/px · z∈[-65,+72]mm · 5 of 144 slices shown (2 of 2)]
[im 1/144]
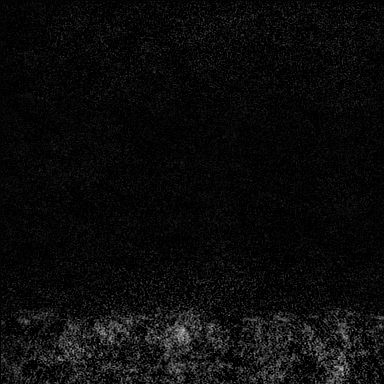
[im 29/144]
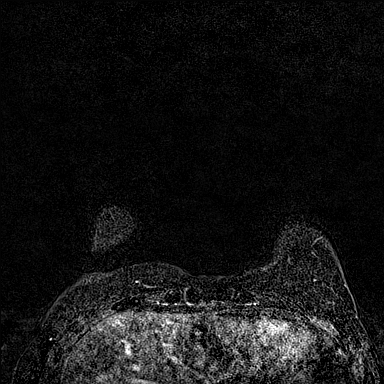
[im 58/144]
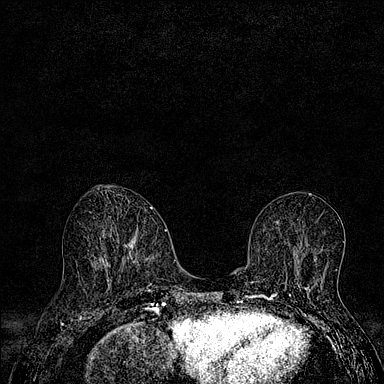
[im 86/144]
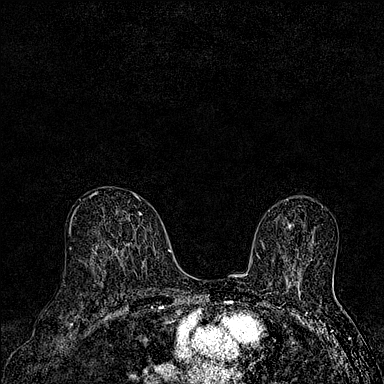
[im 115/144]
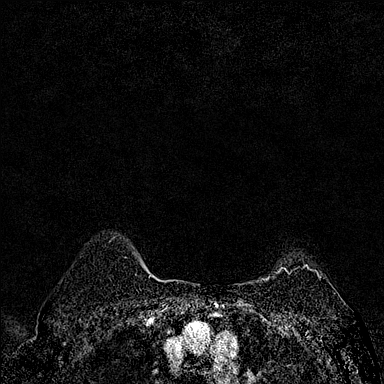

[33 of 48 positions shown; findings below may reference images not displayed]

Three-dimensional MR images were rendered by post-processing of the
original MR data on an independent workstation. The
three-dimensional MR images were interpreted, and findings are
reported in the following complete MRI report for this study. Three
dimensional images were evaluated at the independent interpreting
workstation using the DynaCAD thin client.
FINDINGS: Breast composition: c. Heterogeneous fibroglandular tissue.

Background parenchymal enhancement: Mild

Right breast: No mass or abnormal enhancement.

Left breast: Tissue marker clip is identified adjacent to minimal
enhancement in the anterior UPPER INNER QUADRANT of the LEFT breast.
This is at the site previous concordant biopsy of benign
fibroadenoma. No new or suspicious findings.

Lymph nodes: No abnormal appearing lymph nodes.

Ancillary findings:  None.
IMPRESSION: No MRI evidence for malignancy in either breast.

RECOMMENDATION:
Recommend screening mammogram in [DATE].

Based on the recommendations of the [employer], annual
screening MRI is suggested in addition to annual mammography if the
patient has an estimated lifetime risk of developing breast cancer
which is greater than 20%.

BI-RADS CATEGORY  1: Negative.

## 2021-08-08 MED ORDER — GADOBUTROL 1 MMOL/ML IV SOLN
6.0000 mL | Freq: Once | INTRAVENOUS | Status: AC | PRN
  Administered 2021-08-08: 11:00:00 6 mL via INTRAVENOUS

## 2022-05-08 ENCOUNTER — Other Ambulatory Visit: Payer: Self-pay | Admitting: Obstetrics and Gynecology

## 2022-05-08 DIAGNOSIS — Z803 Family history of malignant neoplasm of breast: Secondary | ICD-10-CM

## 2022-06-07 ENCOUNTER — Other Ambulatory Visit: Payer: Self-pay

## 2022-06-07 ENCOUNTER — Encounter (HOSPITAL_BASED_OUTPATIENT_CLINIC_OR_DEPARTMENT_OTHER): Payer: Self-pay | Admitting: Emergency Medicine

## 2022-06-07 ENCOUNTER — Emergency Department (HOSPITAL_BASED_OUTPATIENT_CLINIC_OR_DEPARTMENT_OTHER)
Admission: EM | Admit: 2022-06-07 | Discharge: 2022-06-07 | Discharge status: Against Medical Advice | Payer: BC Managed Care – PPO | Attending: Emergency Medicine | Admitting: Emergency Medicine

## 2022-06-07 DIAGNOSIS — M79605 Pain in left leg: Secondary | ICD-10-CM | POA: Diagnosis not present

## 2022-06-07 DIAGNOSIS — M7072 Other bursitis of hip, left hip: Secondary | ICD-10-CM | POA: Diagnosis not present

## 2022-06-07 DIAGNOSIS — Y9389 Activity, other specified: Secondary | ICD-10-CM | POA: Diagnosis not present

## 2022-06-07 DIAGNOSIS — M25552 Pain in left hip: Secondary | ICD-10-CM | POA: Diagnosis present

## 2022-06-07 DIAGNOSIS — Z5321 Procedure and treatment not carried out due to patient leaving prior to being seen by health care provider: Secondary | ICD-10-CM | POA: Insufficient documentation

## 2022-06-07 NOTE — ED Triage Notes (Signed)
Pt via pv from home with left hip and leg pain today. She reports that she has had bursitis in left hip but that the leg involvement started today. She feels muscle spasms in the leg and it is difficult to lift the leg. Pt alert & oriented, nad noted.

## 2022-07-05 LAB — COLOGUARD

## 2022-11-14 ENCOUNTER — Ambulatory Visit
Admission: RE | Admit: 2022-11-14 | Discharge: 2022-11-14 | Disposition: A | Discharge status: Home / Self Care | Payer: BC Managed Care – PPO | Source: Ambulatory Visit | Attending: Obstetrics and Gynecology | Admitting: Obstetrics and Gynecology

## 2022-11-14 ENCOUNTER — Other Ambulatory Visit: Payer: BC Managed Care – PPO

## 2022-11-14 DIAGNOSIS — Z803 Family history of malignant neoplasm of breast: Secondary | ICD-10-CM

## 2022-11-14 MED ORDER — GADOPICLENOL 0.5 MMOL/ML IV SOLN
7.0000 mL | Freq: Once | INTRAVENOUS | Status: AC | PRN
  Administered 2022-11-14: 7 mL via INTRAVENOUS

## 2023-05-20 ENCOUNTER — Other Ambulatory Visit: Payer: Self-pay | Admitting: Obstetrics and Gynecology

## 2023-05-20 DIAGNOSIS — Z803 Family history of malignant neoplasm of breast: Secondary | ICD-10-CM

## 2023-06-05 ENCOUNTER — Emergency Department (HOSPITAL_BASED_OUTPATIENT_CLINIC_OR_DEPARTMENT_OTHER): Payer: No Typology Code available for payment source

## 2023-06-05 ENCOUNTER — Encounter (HOSPITAL_BASED_OUTPATIENT_CLINIC_OR_DEPARTMENT_OTHER): Payer: Self-pay | Admitting: Emergency Medicine

## 2023-06-05 ENCOUNTER — Emergency Department (HOSPITAL_BASED_OUTPATIENT_CLINIC_OR_DEPARTMENT_OTHER)
Admission: EM | Admit: 2023-06-05 | Discharge: 2023-06-06 | Disposition: A | Discharge status: Home / Self Care | Payer: 59 | Attending: Emergency Medicine | Admitting: Emergency Medicine

## 2023-06-05 ENCOUNTER — Other Ambulatory Visit: Payer: Self-pay

## 2023-06-05 DIAGNOSIS — R079 Chest pain, unspecified: Secondary | ICD-10-CM | POA: Diagnosis present

## 2023-06-05 DIAGNOSIS — K219 Gastro-esophageal reflux disease without esophagitis: Secondary | ICD-10-CM | POA: Insufficient documentation

## 2023-06-05 DIAGNOSIS — R42 Dizziness and giddiness: Secondary | ICD-10-CM | POA: Insufficient documentation

## 2023-06-05 LAB — CBC WITH DIFFERENTIAL/PLATELET
Abs Immature Granulocytes: 0.04 10*3/uL (ref 0.00–0.07)
Basophils Absolute: 0.1 10*3/uL (ref 0.0–0.1)
Basophils Relative: 1 %
Eosinophils Absolute: 0.1 10*3/uL (ref 0.0–0.5)
Eosinophils Relative: 1 %
HCT: 36 % (ref 36.0–46.0)
Hemoglobin: 12.3 g/dL (ref 12.0–15.0)
Immature Granulocytes: 0 %
Lymphocytes Relative: 38 %
Lymphs Abs: 3.7 10*3/uL (ref 0.7–4.0)
MCH: 32.2 pg (ref 26.0–34.0)
MCHC: 34.2 g/dL (ref 30.0–36.0)
MCV: 94.2 fL (ref 80.0–100.0)
Monocytes Absolute: 0.4 10*3/uL (ref 0.1–1.0)
Monocytes Relative: 4 %
Neutro Abs: 5.5 10*3/uL (ref 1.7–7.7)
Neutrophils Relative %: 56 %
Platelets: 346 10*3/uL (ref 150–400)
RBC: 3.82 MIL/uL — ABNORMAL LOW (ref 3.87–5.11)
RDW: 13.2 % (ref 11.5–15.5)
WBC: 9.8 10*3/uL (ref 4.0–10.5)
nRBC: 0 % (ref 0.0–0.2)

## 2023-06-05 LAB — BASIC METABOLIC PANEL
Anion gap: 11 (ref 5–15)
BUN: 6 mg/dL (ref 6–20)
CO2: 24 mmol/L (ref 22–32)
Calcium: 8.4 mg/dL — ABNORMAL LOW (ref 8.9–10.3)
Chloride: 102 mmol/L (ref 98–111)
Creatinine, Ser: 0.69 mg/dL (ref 0.44–1.00)
GFR, Estimated: >60 mL/min (ref >60)
Glucose, Bld: 104 mg/dL — ABNORMAL HIGH (ref 70–99)
Potassium: 3.3 mmol/L — ABNORMAL LOW (ref 3.5–5.1)
Sodium: 137 mmol/L (ref 135–145)

## 2023-06-05 LAB — D-DIMER, QUANTITATIVE: D-Dimer, Quant: 0.29 ug{FEU}/mL (ref 0.00–0.50)

## 2023-06-05 LAB — TROPONIN I (HIGH SENSITIVITY): Troponin I (High Sensitivity): <2 ng/L (ref <18)

## 2023-06-05 MED ORDER — LIDOCAINE VISCOUS HCL 2 % MT SOLN
15.0000 mL | Freq: Once | OROMUCOSAL | Status: AC
  Administered 2023-06-05: 15 mL via ORAL
  Filled 2023-06-05: qty 15

## 2023-06-05 MED ORDER — ONDANSETRON 4 MG PO TBDP
4.0000 mg | ORAL_TABLET | Freq: Once | ORAL | Status: AC
  Administered 2023-06-06: 4 mg via ORAL
  Filled 2023-06-05: qty 1

## 2023-06-05 MED ORDER — MECLIZINE HCL 25 MG PO TABS
12.5000 mg | ORAL_TABLET | Freq: Once | ORAL | Status: AC
  Administered 2023-06-05: 12.5 mg via ORAL
  Filled 2023-06-05: qty 1

## 2023-06-05 MED ORDER — ALUM & MAG HYDROXIDE-SIMETH 200-200-20 MG/5ML PO SUSP
30.0000 mL | Freq: Once | ORAL | Status: AC
Start: 2023-06-05 — End: 2023-06-05
  Administered 2023-06-05: 30 mL via ORAL
  Filled 2023-06-05: qty 30

## 2023-06-05 MED ORDER — PANTOPRAZOLE SODIUM 40 MG PO TBEC: 40.0000 mg | DELAYED_RELEASE_TABLET | Freq: Every day | ORAL | 0 refills | Status: DC

## 2023-06-05 NOTE — ED Triage Notes (Signed)
Pt had episode of nausea and dizziness this afternoon; sts everything went black; sts it feel like someone is stepping on her chest now and like she can't get a good breath; was seen at [facility] earlier for same

## 2023-06-05 NOTE — Discharge Instructions (Addendum)
You were seen in the emergency room today for dizziness.  Your lab work came back reassuring suggesting this is not cardiac in nature and no sign of blood clot.  Given reassuring exam I recommend picking up meclizine prescribed by urgent care earlier today.  If you continue to have acid reflux I have sent Protonix to your pharmacy which you can pick up as well.  Please follow-up with your primary care doctor to ensure resolution of symptoms.  Please return to emergency room with any new or worsening symptoms.

## 2023-06-05 NOTE — ED Provider Notes (Signed)
[facility] Provider Note   CSN: [id number] Arrival date & time: 06/05/23  2018     History {Add pertinent medical, surgical, social history, OB history to HPI:1} Chief Complaint  Patient presents with   Chest Pain    April Hardy is a 52 y.o. female with past medical history of anxiety, depression, GERD reporting to emergency room today with intermittent nausea, dizziness, chest tightness, patient states symptoms started this morning and patient was seen in urgent care today and prescribed meclizine for vertigo.  Patient has had symptoms like this in the past. Patient has been unable to pick up medication and has not yet taken it.  Patient is having difficult time describing her dizziness, feels like room in spinning. Patient symptoms have not improved thus she return to emergency room.  Dizziness is intermittent and worse after standing. Patient does not have any headache, change in vision, blurry vision, patient is able to ambulate with steady gait.  Patient's not having any numbness tingling or focal weakness.  Chest pain feels like chest tightness worse with taking deep breath, patient feels like she is having a hard time taking a deep breath in.  Patient has had a lot of stressors in her life and history of acid reflux she is wondering if chest pain has been contributed by either of these things.   Chest Pain      Home Medications Prior to Admission medications   Medication Sig Start Date End Date Taking? Authorizing Provider  HYDROcodone-acetaminophen (NORCO/VICODIN) 5-325 MG tablet Take 2 tablets by mouth every 6 (six) hours as needed. 06/22/20   Rolan Bucco, MD  ondansetron (ZOFRAN ODT) 4 MG disintegrating tablet 4mg  ODT q4 hours prn nausea/vomit 06/22/20   Rolan Bucco, MD      Allergies    Codeine and Sulfa antibiotics    Review of Systems   Review of Systems  Cardiovascular:  Positive for chest pain.    Physical  Exam Updated Vital Signs BP 106/64 (BP Location: Left Arm)   Pulse 96   Temp 97.7 F (36.5 C) (Oral)   Resp 12   Ht 5\' 2"  (1.575 m)   Wt 69.9 kg   SpO2 98%   BMI 28.17 kg/m  Physical Exam Vitals and nursing note reviewed.  Constitutional:      General: She is not in acute distress.    Appearance: She is not toxic-appearing.  HENT:     Head: Normocephalic and atraumatic.  Eyes:     General: No scleral icterus.    Extraocular Movements: Extraocular movements intact.     Conjunctiva/sclera: Conjunctivae normal.     Pupils: Pupils are equal, round, and reactive to light.     Comments: No nystagmus  Cardiovascular:     Rate and Rhythm: Normal rate and regular rhythm.     Pulses: Normal pulses.     Heart sounds: Normal heart sounds.  Pulmonary:     Effort: Pulmonary effort is normal. No respiratory distress.     Breath sounds: Normal breath sounds.  Abdominal:     General: Abdomen is flat. Bowel sounds are normal.     Palpations: Abdomen is soft.     Tenderness: There is no abdominal tenderness.  Skin:    General: Skin is warm and dry.     Findings: No lesion.  Neurological:     General: No focal deficit present.     Mental Status: She is alert and oriented to  person, place, and time. Mental status is at baseline.     Cranial Nerves: No cranial nerve deficit.     Sensory: No sensory deficit.     Motor: No weakness.     Coordination: Coordination normal.     Gait: Gait normal.     Comments: Neurovascularly intact.  Strength of lower extremity and sensation equal bilaterally.  Strong dorsal pedal pulse.   Patient is able to hold upper extremity up against resistance, sensation equal bilaterally.  Strong radial pulse.  No cranial nerve deficits. Patient does not have obvious ataxia.  Patient having steady gait.      ED Results / Procedures / Treatments   Labs (all labs ordered are listed, but only abnormal results are displayed) Labs Reviewed  BASIC METABOLIC PANEL -  Abnormal; Notable for the following components:      Result Value   Potassium 3.3 (*)    Glucose, Bld 104 (*)    Calcium 8.4 (*)    All other components within normal limits  CBC WITH DIFFERENTIAL/PLATELET - Abnormal; Notable for the following components:   RBC 3.82 (*)    All other components within normal limits  D-DIMER, QUANTITATIVE  TROPONIN I (HIGH SENSITIVITY)    EKG None  Radiology No results found.  Procedures Procedures  {Document cardiac monitor, telemetry assessment procedure when appropriate:1}  Medications Ordered in ED Medications - No data to display  ED Course/ Medical Decision Making/ A&P   {   Click here for ABCD2, HEART and other calculatorsREFRESH Note before signing :1}                              Medical Decision Making Amount and/or Complexity of Data Reviewed Labs: ordered. Radiology: ordered.  Risk OTC drugs. Prescription drug management.   April Hardy 52 y.o. presented today for chest pain. Working DDx that I considered at this time includes, but not limited to, CVA/TIA, arrhythmia, vertigo, medication s/e, orthostatic hypotension, electrolyte abnormalities, dehydration, URI, ACS, UTI, anemia   R/o DDx: These are considered less likely than current impression due to history of present illness, physical exam, lab/imaging findings   Review of prior external notes: None   Pmhx:   Unique Tests and My Interpretation:  CBC with differential: *** CMP: ***  Trop: Neg Ddimmer: Neg   EKG: Rate, rhythm, axis, intervals all examined: *** Orthostatic vitals*** Hemoccult***  Imaging:  CXR*** CT of head*** CT of chest, PE r/o: ***  Problem List / ED Course / Critical interventions / Medication management  *** I ordered medication including ***  for ***  Reevaluation of the patient after these medicines showed that the patient {resolved/improved/worsened:23923::"improved"} Patients vitals assessed. Upon arrival patient is ***  hemodynamically stable.  I have reviewed the patients home medicines and have made adjustments as needed  Consult: ***  Plan:  F/u w/ PCP in 2-3d to ensure resolution of sx.  Patient was given return precautions. Patient stable for discharge at this time.  Patient educated on current sx/dx and verbalized understanding of plan. Return to ER w/ new or worsening sx.    {Document critical care time when appropriate:1} {Document review of labs and clinical decision tools ie heart score, Chads2Vasc2 etc:1}  {Document your independent review of radiology images, and any outside records:1} {Document your discussion with family members, caretakers, and with consultants:1} {Document social determinants of health affecting pt's care:1} {Document your decision making why or why  not admission, treatments were needed:1} Final Clinical Impression(s) / ED Diagnoses Final diagnoses:  None    Rx / DC Orders ED Discharge Orders     None

## 2023-06-05 NOTE — ED Notes (Addendum)
Patient in stretcher. Provider at bedside.

## 2023-06-24 ENCOUNTER — Other Ambulatory Visit: Payer: Self-pay | Admitting: Internal Medicine

## 2023-06-24 DIAGNOSIS — F17200 Nicotine dependence, unspecified, uncomplicated: Secondary | ICD-10-CM

## 2023-06-28 ENCOUNTER — Other Ambulatory Visit: Payer: Self-pay | Admitting: Diagnostic Radiology

## 2023-06-28 DIAGNOSIS — R102 Pelvic and perineal pain: Secondary | ICD-10-CM

## 2023-06-29 ENCOUNTER — Ambulatory Visit
Admission: RE | Admit: 2023-06-29 | Discharge: 2023-06-29 | Disposition: A | Discharge status: Home / Self Care | Payer: No Typology Code available for payment source | Source: Ambulatory Visit | Attending: Internal Medicine | Admitting: Internal Medicine

## 2023-06-29 ENCOUNTER — Ambulatory Visit
Admission: RE | Admit: 2023-06-29 | Discharge: 2023-06-29 | Disposition: A | Discharge status: Home / Self Care | Payer: No Typology Code available for payment source | Source: Ambulatory Visit | Attending: Diagnostic Radiology | Admitting: Diagnostic Radiology

## 2023-06-29 DIAGNOSIS — R102 Pelvic and perineal pain: Secondary | ICD-10-CM

## 2023-06-29 DIAGNOSIS — F17200 Nicotine dependence, unspecified, uncomplicated: Secondary | ICD-10-CM

## 2023-07-13 ENCOUNTER — Encounter: Payer: Self-pay | Admitting: Urology

## 2023-07-21 ENCOUNTER — Encounter: Payer: Self-pay | Admitting: Internal Medicine

## 2023-09-02 ENCOUNTER — Other Ambulatory Visit: Payer: Self-pay | Admitting: Registered Nurse

## 2023-09-02 DIAGNOSIS — H8113 Benign paroxysmal vertigo, bilateral: Secondary | ICD-10-CM

## 2023-09-02 DIAGNOSIS — R42 Dizziness and giddiness: Secondary | ICD-10-CM

## 2023-09-02 DIAGNOSIS — H9192 Unspecified hearing loss, left ear: Secondary | ICD-10-CM

## 2023-09-02 DIAGNOSIS — M542 Cervicalgia: Secondary | ICD-10-CM

## 2023-09-16 ENCOUNTER — Ambulatory Visit
Admission: RE | Admit: 2023-09-16 | Discharge: 2023-09-16 | Disposition: A | Discharge status: Home / Self Care | Payer: No Typology Code available for payment source | Source: Ambulatory Visit | Attending: Registered Nurse | Admitting: Registered Nurse

## 2023-09-16 DIAGNOSIS — H9192 Unspecified hearing loss, left ear: Secondary | ICD-10-CM

## 2023-09-16 DIAGNOSIS — H8113 Benign paroxysmal vertigo, bilateral: Secondary | ICD-10-CM

## 2023-09-16 DIAGNOSIS — M542 Cervicalgia: Secondary | ICD-10-CM

## 2023-09-16 DIAGNOSIS — R42 Dizziness and giddiness: Secondary | ICD-10-CM

## 2023-09-16 MED ORDER — GADOPICLENOL 0.5 MMOL/ML IV SOLN
7.0000 mL | Freq: Once | INTRAVENOUS | Status: AC | PRN
  Administered 2023-09-16: 7 mL via INTRAVENOUS

## 2023-09-23 NOTE — Progress Notes (Addendum)
 COVID Vaccine received:  []  No [x]  Yes Date of any COVID positive Test in last 90 days: no PCP - Dr. Suan Elm Cardiologist - n/a  Chest x-ray - CT 06/29/23 Epic EKG -  06/05/23 Epic Stress Test -  ECHO -  Cardiac Cath -   Bowel Prep - [x]  No  []   Yes ______  Pacemaker / ICD device [x]  No []  Yes   Spinal Cord Stimulator:[x]  No []  Yes       History of Sleep Apnea? [x]  No []  Yes   CPAP used?- [x]  No []  Yes    Does the patient monitor blood sugar?          [x]  No []  Yes  []  N/A  Patient has: [x]  NO Hx DM   []  Pre-DM                 []  DM1  []   DM2 Does patient have a [employer] or [employer]? []  No []  Yes   Fasting Blood Sugar Ranges-  Checks Blood Sugar _____ times a day  GLP1 agonist / usual dose - no GLP1 instructions:  SGLT-2 inhibitors / usual dose - no SGLT-2 instructions:   Blood Thinner / Instructions:no Aspirin Instructions:no  Comments:   Activity level: Patient is unable  to climb a flight of stairs without difficulty; [x]  No CP  [x]  No SOB, but would have _hip pain__   Patient can perform ADLs without assistance.   Anesthesia review:   Patient denies shortness of breath, fever, cough and chest pain at PAT appointment.  Patient verbalized understanding and agreement to the Pre-Surgical Instructions that were given to them at this PAT appointment. Patient was also educated of the need to review these PAT instructions again prior to his/her surgery.I reviewed the appropriate phone numbers to call if they have any and questions or concerns.

## 2023-09-23 NOTE — Patient Instructions (Addendum)
 SURGICAL WAITING ROOM VISITATION  Patients having surgery or a procedure may have no more than 2 support people in the waiting area - these visitors may rotate.    Children under the age of 24 must have an adult with them who is not the patient.  Due to an increase in RSV and influenza rates and associated hospitalizations, children ages 17 and under may not visit patients in [facility] hospitals.  Visitors with respiratory illnesses are discouraged from visiting and should remain at home.  If the patient needs to stay at the hospital during part of their recovery, the visitor guidelines for inpatient rooms apply. Pre-op nurse will coordinate an appropriate time for 1 support person to accompany patient in pre-op.  This support person may not rotate.    Please refer to the [facility] website for the visitor guidelines for Inpatients (after your surgery is over and you are in a regular room).       Your procedure is scheduled on: 10/07/23   Report to [facility] Main Entrance    Report to admitting at 5:15 AM   Call this number if you have problems the morning of surgery [phone]   Do not eat food :After Midnight.   After Midnight you may have the following liquids until 4:30  DAY OF SURGERY  Water Non-Citrus Juices (without pulp, NO RED-Apple, White grape, White cranberry) Black Coffee (NO MILK/CREAM OR CREAMERS, sugar ok)  Clear Tea (NO MILK/CREAM OR CREAMERS, sugar ok) regular and decaf                             Plain Jell-O (NO RED)                                           Fruit ices (not with fruit pulp, NO RED)                                     Popsicles (NO RED)                                                               Sports drinks like Gatorade (NO RED)                  The day of surgery:  Drink ONE (1) Pre-Surgery Ensure at 4:30 AM the morning of surgery. Drink in one sitting. Do not sip.  This drink was given to you during your hospital   pre-op appointment visit. Nothing else to drink after completing the  Pre-SurgeryEnsure     Oral Hygiene is also important to reduce your risk of infection.                                    Remember - BRUSH YOUR TEETH THE MORNING OF SURGERY WITH YOUR REGULAR TOOTHPASTE   Do NOT smoke after Midnight   Stop all vitamins and herbal supplements 7 days before surgery.   Take these  medicines the morning of surgery with A SIP OF WATER: PANTOPRAZOLE              You may not have any metal on your body including hair pins, jewelry, and body piercing             Do not wear make-up, lotions, powders, perfumes/cologne, or deodorant  Do not wear nail polish including gel and S&S, artificial/acrylic nails, or any other type of covering on natural nails including finger and toenails. If you have artificial nails, gel coating, etc. that needs to be removed by a nail salon please have this removed prior to surgery or surgery may need to be canceled/ delayed if the surgeon/ anesthesia feels like they are unable to be safely monitored.   Do not shave  48 hours prior to surgery.    Do not bring valuables to the hospital. [name] IS NOT             RESPONSIBLE   FOR VALUABLES.   Contacts, glasses, dentures or bridgework may not be worn into surgery.  DO NOT BRING YOUR HOME MEDICATIONS TO THE HOSPITAL. PHARMACY WILL DISPENSE MEDICATIONS LISTED ON YOUR MEDICATION LIST TO YOU DURING YOUR ADMISSION IN THE HOSPITAL!    Patients discharged on the day of surgery will not be allowed to drive home.  Someone NEEDS to stay with you for the first 24 hours after anesthesia.   Special Instructions: Bring a copy of your healthcare power of attorney and living will documents the day of surgery if you haven't scanned them before.              Please read over the following fact sheets you were given: IF YOU HAVE QUESTIONS ABOUT YOUR PRE-OP INSTRUCTIONS PLEASE CALL [phone] April Hardy   If you received a COVID  test during your pre-op visit  it is requested that you wear a mask when out in public, stay away from anyone that may not be feeling well and notify your surgeon if you develop symptoms. If you test positive for Covid or have been in contact with anyone that has tested positive in the last 10 days please notify you surgeon.    [facility] - Preparing for Surgery Before surgery, you can play an important role.  Because skin is not sterile, your skin needs to be as free of germs as possible.  You can reduce the number of germs on your skin by washing with CHG (chlorahexidine gluconate) soap before surgery.  CHG is an antiseptic cleaner which kills germs and bonds with the skin to continue killing germs even after washing. Please DO NOT use if you have an allergy to CHG or antibacterial soaps.  If your skin becomes reddened/irritated stop using the CHG and inform your nurse when you arrive at Short Stay. Do not shave (including legs and underarms) for at least 48 hours prior to the first CHG shower.  You may shave your face/neck.  Please follow these instructions carefully:  1.  Shower with CHG Soap the night before surgery and the  morning of surgery.  2.  If you choose to wash your hair, wash your hair first as usual with your normal  shampoo.  3.  After you shampoo, rinse your hair and body thoroughly to remove the shampoo.                             4.  Use CHG  as you would any other liquid soap.  You can apply chg directly to the skin and wash.  Gently with a scrungie or clean washcloth.  5.  Apply the CHG Soap to your body ONLY FROM THE NECK DOWN.   Do   not use on face/ open                           Wound or open sores. Avoid contact with eyes, ears mouth and   genitals (private parts).                       Wash face,  Genitals (private parts) with your normal soap.             6.  Wash thoroughly, paying special attention to the area where your    surgery  will be performed.  7.  Thoroughly  rinse your body with warm water from the neck down.  8.  DO NOT shower/wash with your normal soap after using and rinsing off the CHG Soap.                9.  Pat yourself dry with a clean towel.            10.  Wear clean pajamas.            11.  Place clean sheets on your bed the night of your first shower and do not  sleep with pets. Day of Surgery : Do not apply any lotions/deodorants the morning of surgery.  Please wear clean clothes to the hospital/surgery center.  FAILURE TO FOLLOW THESE INSTRUCTIONS MAY RESULT IN THE CANCELLATION OF YOUR SURGERY  PATIENT SIGNATURE_________________________________  NURSE SIGNATURE__________________________________   Incentive Spirometer  An incentive spirometer is a tool that can help keep your lungs clear and active. This tool measures how well you are filling your lungs with each breath. Taking long deep breaths may help reverse or decrease the chance of developing breathing (pulmonary) problems (especially infection) following: A long period of time when you are unable to move or be active. BEFORE THE PROCEDURE  If the spirometer includes an indicator to show your best effort, your nurse or respiratory therapist will set it to a desired goal. If possible, sit up straight or lean slightly forward. Try not to slouch. Hold the incentive spirometer in an upright position. INSTRUCTIONS FOR USE  Sit on the edge of your bed if possible, or sit up as far as you can in bed or on a chair. Hold the incentive spirometer in an upright position. Breathe out normally. Place the mouthpiece in your mouth and seal your lips tightly around it. Breathe in slowly and as deeply as possible, raising the piston or the ball toward the top of the column. Hold your breath for 3-5 seconds or for as long as possible. Allow the piston or ball to fall to the bottom of the column. Remove the mouthpiece from your mouth and breathe out normally. Rest for a few seconds and  repeat Steps 1 through 7 at least 10 times every 1-2 hours when you are awake. Take your time and take a few normal breaths between deep breaths. The spirometer may include an indicator to show your best effort. Use the indicator as a goal to work toward during each repetition. After each set of 10 deep breaths, practice coughing to be sure your lungs are clear. If you  have an incision (the cut made at the time of surgery), support your incision when coughing by placing a pillow or rolled up towels firmly against it. Once you are able to get out of bed, walk around indoors and cough well. You may stop using the incentive spirometer when instructed by your caregiver.  RISKS AND COMPLICATIONS Take your time so you do not get dizzy or light-headed. If you are in pain, you may need to take or ask for pain medication before doing incentive spirometry. It is harder to take a deep breath if you are having pain. AFTER USE Rest and breathe slowly and easily. It can be helpful to keep track of a log of your progress. Your caregiver can provide you with a simple table to help with this. If you are using the spirometer at home, follow these instructions: SEEK MEDICAL CARE IF:  You are having difficultly using the spirometer. You have trouble using the spirometer as often as instructed. Your pain medication is not giving enough relief while using the spirometer. You develop fever of 100.5 F (38.1 C) or higher. SEEK IMMEDIATE MEDICAL CARE IF:  You cough up bloody sputum that had not been present before. You develop fever of 102 F (38.9 C) or greater. You develop worsening pain at or near the incision site. MAKE SURE YOU:  Understand these instructions. Will watch your condition. Will get help right away if you are not doing well or get worse. Document Released: 12/14/2006 Document Revised: 10/26/2011 Document Reviewed: 02/14/2007 [facility] Patient Information 2014 [place], [place].

## 2023-09-27 ENCOUNTER — Encounter (HOSPITAL_COMMUNITY): Payer: Self-pay

## 2023-09-27 ENCOUNTER — Other Ambulatory Visit: Payer: Self-pay

## 2023-09-27 ENCOUNTER — Encounter (HOSPITAL_COMMUNITY)
Admission: RE | Admit: 2023-09-27 | Discharge: 2023-09-27 | Disposition: A | Discharge status: Home / Self Care | Payer: No Typology Code available for payment source | Source: Ambulatory Visit | Attending: Orthopedic Surgery | Admitting: Orthopedic Surgery

## 2023-09-27 VITALS — BP 124/60 | HR 80 | Temp 98.1°F | Resp 16 | Ht 62.0 in | Wt 150.0 lb

## 2023-09-27 DIAGNOSIS — M7062 Trochanteric bursitis, left hip: Secondary | ICD-10-CM | POA: Insufficient documentation

## 2023-09-27 DIAGNOSIS — Z01812 Encounter for preprocedural laboratory examination: Secondary | ICD-10-CM | POA: Diagnosis not present

## 2023-09-27 DIAGNOSIS — Z01818 Encounter for other preprocedural examination: Secondary | ICD-10-CM

## 2023-09-27 HISTORY — DX: Unspecified osteoarthritis, unspecified site: M19.90

## 2023-09-27 LAB — BASIC METABOLIC PANEL
Anion gap: 10 (ref 5–15)
BUN: 9 mg/dL (ref 6–20)
CO2: 24 mmol/L (ref 22–32)
Calcium: 8.7 mg/dL — ABNORMAL LOW (ref 8.9–10.3)
Chloride: 104 mmol/L (ref 98–111)
Creatinine, Ser: 0.66 mg/dL (ref 0.44–1.00)
GFR, Estimated: >60 mL/min (ref >60)
Glucose, Bld: 103 mg/dL — ABNORMAL HIGH (ref 70–99)
Potassium: 3.5 mmol/L (ref 3.5–5.1)
Sodium: 138 mmol/L (ref 135–145)

## 2023-09-27 LAB — CBC
HCT: 36.3 % (ref 36.0–46.0)
Hemoglobin: 12 g/dL (ref 12.0–15.0)
MCH: 32.3 pg (ref 26.0–34.0)
MCHC: 33.1 g/dL (ref 30.0–36.0)
MCV: 97.8 fL (ref 80.0–100.0)
Platelets: 340 10*3/uL (ref 150–400)
RBC: 3.71 MIL/uL — ABNORMAL LOW (ref 3.87–5.11)
RDW: 14.4 % (ref 11.5–15.5)
WBC: 9 10*3/uL (ref 4.0–10.5)
nRBC: 0 % (ref 0.0–0.2)

## 2023-10-04 NOTE — H&P (Signed)
 HIP BURSECTOMY ADMISSION H&P  Patient is admitted for left hip bursectomy   Therapy Plans: none Disposition: Home with husband Planned DVT Prophylaxis: aspirin 81mg  BID DME needed: none PCP: Dr. Jacky Kindle - clearance received TXA: IV Allergies: sulfa - hives, codeine - nausea Anesthesia Concerns: none BMI: 27.7 Last HgbA1c: Not diabetic   Other: - No hx of VTE - oxycodone, flexeril 10 mg, tylenol, meloxicam vs diclofenac - Prior IA injection - no benefit, injection of lidocaine into joint later was beneficial   Subjective:  Chief Complaint: left hip pain  HPI: April Hardy, 53 y.o. female, with a long history of left hip pain. She has been treated with PT, troch injections, IA injections, and NSAIDs without benefit. She has a combination of anterior hip/groin pain and lateral hip pain ,but notes most significant pain over her lateral hip. She had an MRI in 2024 which indicated bursitis as well as labral pathology. Dr. Charlann Boxer and her discussed treatment options, and elected to try an open bursectomy.   There are no active problems to display for this patient.  Past Medical History:  Diagnosis Date   Anxiety and depression    Arthritis    GERD (gastroesophageal reflux disease)     Past Surgical History:  Procedure Laterality Date   ABDOMINAL HYSTERECTOMY     CESAREAN SECTION     x2   CHOLECYSTECTOMY     NASAL SINUS SURGERY      No current facility-administered medications for this encounter.   Current Outpatient Medications  Medication Sig Dispense Refill Last Dose/Taking   ADDERALL 20 MG tablet Take 20 mg by mouth 2 (two) times daily as needed (focus/attention).   Taking As Needed   albuterol (VENTOLIN HFA) 108 (90 Base) MCG/ACT inhaler Inhale 1-2 puffs into the lungs every 6 (six) hours as needed for wheezing or shortness of breath.   Taking As Needed   ALPRAZolam (XANAX) 1 MG tablet Take 1 mg by mouth 2 (two) times daily as needed for anxiety.   Taking As Needed    atorvastatin (LIPITOR) 40 MG tablet Take 40 mg by mouth every Sunday.   Taking   Cholecalciferol (VITAMIN D3 PO) Take 1 tablet by mouth in the morning.   Taking   cyanocobalamin (VITAMIN B12) 1000 MCG/ML injection Inject 1,000 mcg into the muscle every 14 (fourteen) days.   Taking   cyclobenzaprine (FLEXERIL) 10 MG tablet Take 10 mg by mouth 3 (three) times daily as needed for muscle spasms.   Taking As Needed   diclofenac (VOLTAREN) 75 MG EC tablet Take 75 mg by mouth 2 (two) times daily.   Taking   DULoxetine (CYMBALTA) 30 MG capsule Take 30 mg by mouth in the morning. 30 mg + 60 mg=90 mg   Taking   DULoxetine (CYMBALTA) 60 MG capsule Take 60 mg by mouth in the morning. 60 mg + 30 mg=90 mg   Taking   estradiol (ESTRACE) 0.1 MG/GM vaginal cream Place 1 Applicatorful vaginally at bedtime.   Taking   famotidine (PEPCID) 40 MG tablet Take 40 mg by mouth in the morning.   Taking   fluticasone (FLONASE) 50 MCG/ACT nasal spray Place 1 spray into both nostrils in the morning.   Taking   loratadine (CLARITIN) 10 MG tablet Take 10 mg by mouth daily as needed for allergies.   Taking As Needed   meclizine (ANTIVERT) 25 MG tablet Take 25 mg by mouth every 6 (six) hours as needed (dizziness/vertigo).   Taking As  Needed   nitrofurantoin (MACRODANTIN) 50 MG capsule Take 50 mg by mouth daily as needed (urinary tract infection prevention.).   Taking As Needed   Vibegron (GEMTESA) 75 MG TABS Take 75 mg by mouth in the morning.   Taking   TESTOSTERONE IL Inject 1 Dose into the skin every 3 (three) months. Testosterone/estradiol Pellet Therapy      traZODone (DESYREL) 50 MG tablet Take 50-150 mg by mouth at bedtime as needed for sleep.      Allergies  Allergen Reactions   Codeine Nausea Only   Sulfa Antibiotics Hives    Social History   Tobacco Use   Smoking status: Every Day    Current packs/day: 0.50    Types: Cigarettes   Smokeless tobacco: Never  Substance Use Topics   Alcohol use: Never    No  family history on file.   Review of Systems  Constitutional:  Negative for chills and fever.  Respiratory:  Negative for cough and shortness of breath.   Cardiovascular:  Negative for chest pain.  Gastrointestinal:  Negative for nausea and vomiting.  Musculoskeletal:  Positive for arthralgias.     Objective:  Physical Exam Well nourished and well developed. General: Alert and oriented x3, cooperative and pleasant, no acute distress. Head: normocephalic, atraumatic, neck supple.  Musculoskeletal: Left hip exam: Passive range of motion of the left hip remains fairly fluid as compared to her right hip She does have some discomfort with hip flexion external rotation and abduction laterally Tenderness laterally Neurovascular intact distally  Calves soft and nontender. Motor function intact in LE. Strength 5/5 LE bilaterally. Neuro: Distal pulses 2+. Sensation to light touch intact in LE.  Vital signs in last 24 hours:    Labs:   Estimated body mass index is 27.44 kg/m as calculated from the following:   Height as of 09/27/23: 5\' 2"  (1.575 m).   Weight as of 09/27/23: 68 kg.   Imaging Review  Imaging: We ordered an AP pelvis and lateral of the left hip. The radiographs indicate mild joint space narrowing in both hips without evidence of femoral head pathology. No evidence of any acute pathology noted.  The most recent MRI of her left hip was performed in August 2024. There is evidence of degenerative type labral pathology at the anterior superior aspect with also findings of left greater than right trochanteric bursitis     Assessment/Plan:  Greater trochanteric pain syndrome, left hip(s)  The patient history, physical examination, clinical judgement of the provider and imaging studies are consistent with Greater trochanteric pain syndrome of the left hip(s) and bursectomy is deemed medically necessary. The treatment options including medical management, injection  therapy, arthroscopy and arthroplasty were discussed at length. The risks and benefits of bursectomy were presented and reviewed. The risks due to aseptic loosening, infection, stiffness, dislocation/subluxation,  thromboembolic complications and other imponderables were discussed.  The patient acknowledged the explanation, agreed to proceed with the plan and consent was signed. Patient is being admitted for inpatient treatment for surgery, pain control, PT, OT, prophylactic antibiotics, VTE prophylaxis, progressive ambulation and ADL's and discharge planning.The patient is planning to be discharged  home.   Rosalene Billings, PA-C Orthopedic Surgery [facility] [place] [phone]

## 2023-10-07 ENCOUNTER — Encounter (HOSPITAL_COMMUNITY)
Admission: RE | Disposition: A | Discharge status: Home / Self Care | Payer: Self-pay | Source: Ambulatory Visit | Attending: Orthopedic Surgery

## 2023-10-07 ENCOUNTER — Ambulatory Visit (HOSPITAL_COMMUNITY)
Admission: RE | Admit: 2023-10-07 | Discharge: 2023-10-07 | Disposition: A | Discharge status: Home / Self Care | Payer: No Typology Code available for payment source | Source: Ambulatory Visit | Attending: Orthopedic Surgery | Admitting: Orthopedic Surgery

## 2023-10-07 ENCOUNTER — Other Ambulatory Visit: Payer: Self-pay

## 2023-10-07 ENCOUNTER — Encounter (HOSPITAL_COMMUNITY): Payer: Self-pay | Admitting: Orthopedic Surgery

## 2023-10-07 ENCOUNTER — Ambulatory Visit (HOSPITAL_BASED_OUTPATIENT_CLINIC_OR_DEPARTMENT_OTHER): Payer: No Typology Code available for payment source

## 2023-10-07 ENCOUNTER — Ambulatory Visit (HOSPITAL_COMMUNITY): Payer: Self-pay

## 2023-10-07 DIAGNOSIS — K219 Gastro-esophageal reflux disease without esophagitis: Secondary | ICD-10-CM | POA: Diagnosis not present

## 2023-10-07 DIAGNOSIS — M7062 Trochanteric bursitis, left hip: Secondary | ICD-10-CM

## 2023-10-07 DIAGNOSIS — F17210 Nicotine dependence, cigarettes, uncomplicated: Secondary | ICD-10-CM | POA: Diagnosis not present

## 2023-10-07 DIAGNOSIS — Z9889 Other specified postprocedural states: Secondary | ICD-10-CM

## 2023-10-07 DIAGNOSIS — M7632 Iliotibial band syndrome, left leg: Secondary | ICD-10-CM | POA: Insufficient documentation

## 2023-10-07 DIAGNOSIS — M1990 Unspecified osteoarthritis, unspecified site: Secondary | ICD-10-CM | POA: Diagnosis not present

## 2023-10-07 DIAGNOSIS — M25552 Pain in left hip: Secondary | ICD-10-CM | POA: Diagnosis present

## 2023-10-07 HISTORY — PX: EXCISION/RELEASE BURSA HIP: SHX5014

## 2023-10-07 LAB — TYPE AND SCREEN
ABO/RH(D): A POS
Antibody Screen: NEGATIVE

## 2023-10-07 SURGERY — RELEASE, BURSA, TROCHANTERIC
Anesthesia: General | Site: Hip | Laterality: Left

## 2023-10-07 MED ORDER — KETOROLAC TROMETHAMINE 30 MG/ML IJ SOLN
INTRAMUSCULAR | Status: AC
  Filled 2023-10-07: qty 1

## 2023-10-07 MED ORDER — DICLOFENAC SODIUM 75 MG PO TBEC: 75.0000 mg | DELAYED_RELEASE_TABLET | Freq: Two times a day (BID) | ORAL | 0 refills | Status: AC

## 2023-10-07 MED ORDER — ACETAMINOPHEN 10 MG/ML IV SOLN
INTRAVENOUS | Status: AC
  Filled 2023-10-07: qty 100

## 2023-10-07 MED ORDER — SODIUM CHLORIDE 0.9% FLUSH
3.0000 mL | Freq: Two times a day (BID) | INTRAVENOUS | Status: DC
Start: 2023-10-07 — End: 2023-10-07

## 2023-10-07 MED ORDER — ONDANSETRON HCL 4 MG/2ML IJ SOLN
INTRAMUSCULAR | Status: AC
  Filled 2023-10-07: qty 2

## 2023-10-07 MED ORDER — OXYCODONE HCL 5 MG PO TABS: 5.0000 mg | ORAL_TABLET | Freq: Four times a day (QID) | ORAL | 0 refills | Status: AC | PRN

## 2023-10-07 MED ORDER — LIDOCAINE HCL (PF) 2 % IJ SOLN
INTRAMUSCULAR | Status: AC
  Filled 2023-10-07: qty 5

## 2023-10-07 MED ORDER — ALBUMIN HUMAN 5 % IV SOLN: INTRAVENOUS | Status: DC | PRN

## 2023-10-07 MED ORDER — POLYETHYLENE GLYCOL 3350 17 G PO PACK: 17.0000 g | PACK | Freq: Two times a day (BID) | ORAL | Status: AC

## 2023-10-07 MED ORDER — PHENYLEPHRINE HCL-NACL 20-0.9 MG/250ML-% IV SOLN
INTRAVENOUS | Status: DC | PRN
  Administered 2023-10-07: 60 ug/min via INTRAVENOUS

## 2023-10-07 MED ORDER — HYDROMORPHONE HCL 1 MG/ML IJ SOLN
0.5000 mg | INTRAMUSCULAR | Status: DC | PRN
  Administered 2023-10-07: 0.5 mg via INTRAVENOUS

## 2023-10-07 MED ORDER — ACETAMINOPHEN 500 MG PO TABS: 1000.0000 mg | ORAL_TABLET | Freq: Once | ORAL | Status: DC | PRN

## 2023-10-07 MED ORDER — OXYCODONE HCL 5 MG PO TABS
ORAL_TABLET | ORAL | Status: AC
  Filled 2023-10-07: qty 1

## 2023-10-07 MED ORDER — FENTANYL CITRATE PF 50 MCG/ML IJ SOSY
PREFILLED_SYRINGE | INTRAMUSCULAR | Status: AC
  Administered 2023-10-07: 50 ug via INTRAVENOUS
  Filled 2023-10-07: qty 3

## 2023-10-07 MED ORDER — CEFAZOLIN SODIUM-DEXTROSE 2-4 GM/100ML-% IV SOLN
2.0000 g | INTRAVENOUS | Status: AC
  Administered 2023-10-07: 2 g via INTRAVENOUS
  Filled 2023-10-07: qty 100

## 2023-10-07 MED ORDER — PHENYLEPHRINE 80 MCG/ML (10ML) SYRINGE FOR IV PUSH (FOR BLOOD PRESSURE SUPPORT)
PREFILLED_SYRINGE | INTRAVENOUS | Status: AC
  Filled 2023-10-07: qty 10

## 2023-10-07 MED ORDER — ONDANSETRON HCL 4 MG/2ML IJ SOLN
INTRAMUSCULAR | Status: DC | PRN
  Administered 2023-10-07: 4 mg via INTRAVENOUS

## 2023-10-07 MED ORDER — POVIDONE-IODINE 10 % EX SWAB: 2.0000 | Freq: Once | CUTANEOUS | Status: DC

## 2023-10-07 MED ORDER — FENTANYL CITRATE (PF) 100 MCG/2ML IJ SOLN
INTRAMUSCULAR | Status: AC
  Filled 2023-10-07: qty 2

## 2023-10-07 MED ORDER — ACETAMINOPHEN 10 MG/ML IV SOLN
INTRAVENOUS | Status: DC | PRN
  Administered 2023-10-07: 1000 mg via INTRAVENOUS

## 2023-10-07 MED ORDER — VASOPRESSIN 20 UNIT/ML IV SOLN
INTRAVENOUS | Status: AC
  Filled 2023-10-07: qty 1

## 2023-10-07 MED ORDER — OXYCODONE HCL 5 MG PO TABS
ORAL_TABLET | ORAL | Status: AC
  Administered 2023-10-07: 5 mg via ORAL
  Filled 2023-10-07: qty 1

## 2023-10-07 MED ORDER — LACTATED RINGERS IV SOLN: INTRAVENOUS | Status: DC

## 2023-10-07 MED ORDER — PROPOFOL 10 MG/ML IV BOLUS
INTRAVENOUS | Status: AC
  Filled 2023-10-07: qty 20

## 2023-10-07 MED ORDER — ONDANSETRON HCL 4 MG PO TABS: 4.0000 mg | ORAL_TABLET | Freq: Four times a day (QID) | ORAL | Status: DC | PRN

## 2023-10-07 MED ORDER — OXYCODONE HCL 5 MG PO TABS: 5.0000 mg | ORAL_TABLET | ORAL | Status: DC | PRN

## 2023-10-07 MED ORDER — FENTANYL CITRATE (PF) 250 MCG/5ML IJ SOLN
INTRAMUSCULAR | Status: DC | PRN
  Administered 2023-10-07: 50 ug via INTRAVENOUS

## 2023-10-07 MED ORDER — EPHEDRINE 5 MG/ML INJ
INTRAVENOUS | Status: AC
  Filled 2023-10-07: qty 5

## 2023-10-07 MED ORDER — SENNA 8.6 MG PO TABS: 1.0000 | ORAL_TABLET | Freq: Every day | ORAL | 0 refills | Status: AC

## 2023-10-07 MED ORDER — ACETAMINOPHEN 160 MG/5ML PO SOLN: 1000.0000 mg | Freq: Once | ORAL | Status: DC | PRN

## 2023-10-07 MED ORDER — MIDAZOLAM HCL 2 MG/2ML IJ SOLN
INTRAMUSCULAR | Status: AC
  Filled 2023-10-07: qty 2

## 2023-10-07 MED ORDER — DEXAMETHASONE SODIUM PHOSPHATE 10 MG/ML IJ SOLN
INTRAMUSCULAR | Status: DC | PRN
  Administered 2023-10-07: 8 mg via INTRAVENOUS

## 2023-10-07 MED ORDER — LIDOCAINE HCL (CARDIAC) PF 100 MG/5ML IV SOSY
PREFILLED_SYRINGE | INTRAVENOUS | Status: DC | PRN
  Administered 2023-10-07: 60 mg via INTRATRACHEAL

## 2023-10-07 MED ORDER — EPHEDRINE SULFATE (PRESSORS) 50 MG/ML IJ SOLN
INTRAMUSCULAR | Status: DC | PRN
  Administered 2023-10-07: 10 mg via INTRAVENOUS
  Administered 2023-10-07: 5 mg via INTRAVENOUS
  Administered 2023-10-07: 10 mg via INTRAVENOUS

## 2023-10-07 MED ORDER — HYDROMORPHONE HCL 1 MG/ML IJ SOLN
INTRAMUSCULAR | Status: AC
  Administered 2023-10-07: 0.5 mg via INTRAVENOUS
  Filled 2023-10-07: qty 1

## 2023-10-07 MED ORDER — CHLORHEXIDINE GLUCONATE 0.12 % MT SOLN
15.0000 mL | Freq: Once | OROMUCOSAL | Status: AC
  Administered 2023-10-07: 15 mL via OROMUCOSAL

## 2023-10-07 MED ORDER — OXYCODONE HCL 5 MG PO TABS
5.0000 mg | ORAL_TABLET | Freq: Once | ORAL | Status: AC | PRN
  Administered 2023-10-07: 5 mg via ORAL

## 2023-10-07 MED ORDER — PROPOFOL 10 MG/ML IV BOLUS
INTRAVENOUS | Status: DC | PRN
  Administered 2023-10-07: 160 mg via INTRAVENOUS

## 2023-10-07 MED ORDER — DEXAMETHASONE SODIUM PHOSPHATE 10 MG/ML IJ SOLN: 8.0000 mg | Freq: Once | INTRAMUSCULAR | Status: DC

## 2023-10-07 MED ORDER — MIDAZOLAM HCL 2 MG/2ML IJ SOLN
INTRAMUSCULAR | Status: DC | PRN
  Administered 2023-10-07: 2 mg via INTRAVENOUS

## 2023-10-07 MED ORDER — SODIUM CHLORIDE 0.9% FLUSH: 3.0000 mL | INTRAVENOUS | Status: DC | PRN

## 2023-10-07 MED ORDER — FENTANYL CITRATE PF 50 MCG/ML IJ SOSY
25.0000 ug | PREFILLED_SYRINGE | INTRAMUSCULAR | Status: DC | PRN
  Administered 2023-10-07 (×2): 50 ug via INTRAVENOUS

## 2023-10-07 MED ORDER — ORAL CARE MOUTH RINSE: 15.0000 mL | Freq: Once | OROMUCOSAL | Status: AC

## 2023-10-07 MED ORDER — 0.9 % SODIUM CHLORIDE (POUR BTL) OPTIME
TOPICAL | Status: DC | PRN
  Administered 2023-10-07: 1000 mL

## 2023-10-07 MED ORDER — ACETAMINOPHEN 10 MG/ML IV SOLN: 1000.0000 mg | Freq: Once | INTRAVENOUS | Status: DC | PRN

## 2023-10-07 MED ORDER — KETOROLAC TROMETHAMINE 30 MG/ML IJ SOLN
30.0000 mg | Freq: Once | INTRAMUSCULAR | Status: AC
  Administered 2023-10-07: 30 mg via INTRAVENOUS

## 2023-10-07 MED ORDER — PROPOFOL 1000 MG/100ML IV EMUL
INTRAVENOUS | Status: AC
  Filled 2023-10-07: qty 100

## 2023-10-07 MED ORDER — DEXAMETHASONE SODIUM PHOSPHATE 10 MG/ML IJ SOLN
INTRAMUSCULAR | Status: AC
  Filled 2023-10-07: qty 1

## 2023-10-07 MED ORDER — VASOPRESSIN 20 UNIT/ML IV SOLN
INTRAVENOUS | Status: DC | PRN
  Administered 2023-10-07: 1 [IU] via INTRAVENOUS

## 2023-10-07 MED ORDER — PHENYLEPHRINE HCL (PRESSORS) 10 MG/ML IV SOLN
INTRAVENOUS | Status: DC | PRN
  Administered 2023-10-07: 120 ug via INTRAVENOUS
  Administered 2023-10-07: 200 ug via INTRAVENOUS
  Administered 2023-10-07: 280 ug via INTRAVENOUS
  Administered 2023-10-07: 200 ug via INTRAVENOUS

## 2023-10-07 MED ORDER — OXYCODONE HCL 5 MG/5ML PO SOLN: 5.0000 mg | Freq: Once | ORAL | Status: AC | PRN

## 2023-10-07 MED ORDER — ONDANSETRON HCL 4 MG/2ML IJ SOLN
4.0000 mg | Freq: Four times a day (QID) | INTRAMUSCULAR | Status: DC | PRN
  Administered 2023-10-07: 4 mg via INTRAVENOUS

## 2023-10-07 SURGICAL SUPPLY — 34 items
BAG COUNTER SPONGE SURGICOUNT (BAG) IMPLANT
BAG ZIPLOCK 12X15 (MISCELLANEOUS) ×1 IMPLANT
BIT DRILL 2.0X128 (BIT) ×1 IMPLANT
COVER SURGICAL LIGHT HANDLE (MISCELLANEOUS) ×1 IMPLANT
DERMABOND ADVANCED .7 DNX12 (GAUZE/BANDAGES/DRESSINGS) ×1 IMPLANT
DRAPE INCISE IOBAN 85X60 (DRAPES) ×1 IMPLANT
DRAPE POUCH INSTRU U-SHP 10X18 (DRAPES) ×1 IMPLANT
DRAPE SURG 17X11 SM STRL (DRAPES) ×1 IMPLANT
DRAPE SURG ORHT 6 SPLT 77X108 (DRAPES) ×1 IMPLANT
DRAPE U-SHAPE 47X51 STRL (DRAPES) ×1 IMPLANT
DRESSING AQUACEL AG SP 3.5X10 (GAUZE/BANDAGES/DRESSINGS) ×1 IMPLANT
DRSG AQUACEL AG ADV 3.5X10 (GAUZE/BANDAGES/DRESSINGS) IMPLANT
DRSG AQUACEL AG SP 3.5X10 (GAUZE/BANDAGES/DRESSINGS) ×1 IMPLANT
DURAPREP 26ML APPLICATOR (WOUND CARE) ×1 IMPLANT
ELECT REM PT RETURN 15FT ADLT (MISCELLANEOUS) ×1 IMPLANT
GLOVE BIOGEL PI IND STRL 7.5 (GLOVE) ×2 IMPLANT
GLOVE BIOGEL PI IND STRL 8.5 (GLOVE) ×1 IMPLANT
GLOVE ECLIPSE 8.0 STRL XLNG CF (GLOVE) IMPLANT
GLOVE INDICATOR 6.5 STRL GRN (GLOVE) ×1 IMPLANT
GOWN STRL REUS W/ TWL LRG LVL3 (GOWN DISPOSABLE) ×2 IMPLANT
KIT BASIN OR (CUSTOM PROCEDURE TRAY) ×1 IMPLANT
KIT TURNOVER KIT A (KITS) IMPLANT
MANIFOLD NEPTUNE II (INSTRUMENTS) ×1 IMPLANT
NS IRRIG 1000ML POUR BTL (IV SOLUTION) ×1 IMPLANT
PACK TOTAL JOINT (CUSTOM PROCEDURE TRAY) ×1 IMPLANT
PROTECTOR NERVE ULNAR (MISCELLANEOUS) ×1 IMPLANT
STAPLER VISISTAT (STAPLE) IMPLANT
SUT ETHIBOND NAB CT1 #1 30IN (SUTURE) IMPLANT
SUT FIBERWIRE #2 38 T-5 BLUE (SUTURE) IMPLANT
SUT MNCRL AB 4-0 PS2 18 (SUTURE) ×1 IMPLANT
SUT VIC AB 1 CT1 27XBRD ANTBC (SUTURE) ×3 IMPLANT
SUT VIC AB 2-0 CT1 TAPERPNT 27 (SUTURE) ×1 IMPLANT
SUTURE FIBERWR #2 38 T-5 BLUE (SUTURE) IMPLANT
TOWEL OR 17X26 10 PK STRL BLUE (TOWEL DISPOSABLE) ×2 IMPLANT

## 2023-10-07 NOTE — Op Note (Signed)
 NAME: April Hardy, SOLLERS MEDICAL RECORD NO: [id number] ACCOUNT NO: [id number] DATE OF BIRTH: March 04, 1971 FACILITY: [facility] LOCATION: [facility] PHYSICIAN: Madlyn Frankel. Charlann Boxer, MD  Operative Report   DATE OF PROCEDURE: 10/07/2023  PREOPERATIVE DIAGNOSES:  Chronic left lateral hip pain with diagnosis of chronic left trochanteric bursitis associated with lateral friction syndrome over the trochanter related to the tensor fascia lata and IT band.  POSTOPERATIVE DIAGNOSES:  Chronic left lateral hip pain with diagnosis of chronic left trochanteric bursitis associated with lateral friction syndrome over the trochanter related to the tensor fascia lata and IT band.  PROCEDURE: 1.  Left hip trochanteric bursectomy. 2.  Left iliotibial band recession.  SURGEON:  Madlyn Frankel. Charlann Boxer, MD  ASSISTANT:  Rosalene Billings, PA-C.  Note that PA, Domenic Schwab was present for the entirety of the case from preoperative positioning, perioperative management of the operative extremity, general facilitation of the case and primary wound closure.  ANESTHESIA:  General.  BLOOD LOSS:  Less than 10 mL.  DRAINS:  None.  COMPLICATIONS:  None.  INDICATIONS FOR THE PROCEDURE:  The patient is a 53 year old female who had been seen and evaluated over the past 1-2 years with lateral-based hip pain.  She had had tried conservative treatment including injections, medications, stretching, and icing  with persistent symptoms.  MRI had been performed of her left hip.  The MRI did indicate findings for trochanteric bursitis and inflammation as well as some intraarticular pathology.  We had a lengthy discussion regarding management.  Based on the  location of her symptoms and exam findings and MRI findings, I felt that the predominant source of her pain was extraarticular.  Based on her failed attempts at conservative treatment, we discussed proceeding with surgery to remove the bursa and recess  the IT band to decrease the tension and stress  laterally.  The risks of persistent or recurrent pain related to this area as well as risks of infection and DVT reviewed as being fairly minimal.  Consent was obtained for management of her symptoms as  described.  DESCRIPTION OF PROCEDURE:  The patient was brought to the operative theater.  Once adequate anesthesia, preoperative antibiotics, Ancef administered, she was positioned into the right lateral decubitus position with the left hip up.  The left lower  extremity was then prepped and draped in a sterile fashion.  A timeout was performed identifying the patient, planned procedure, and extremity.  We identified her lateral trochanter on the soft tissues.  An incision was then made laterally.  Soft tissue  dissection was carried down to the iliotibial band over the trochanter and then exposing the distal aspect of the gluteal tendons.  Once this was carried out, I opened up the iliotibial band.  There was a blush of bursal fluid.  The iliotibial band in  this area was noted to be very tight over the trochanter.  I thus used surgical towels to abduct the leg.  I then incised the IT band approximately an inch on either direction from vastus ridge.  I then excised the bursal sac using the Bovie trying to  maintain as much hemostasis as possible.  Once this was done, I performed a recession including a Z-type recession to relax the IT band over the lateral aspect of the trochanter.  A small portion of the IT band was excised.  Once this was done, I used #1  Vicryl sutures to reapproximate portions of the IT band again to make certain that it remained recessed and open over  the trochanter, but also to prevent any further propagation.  We irrigated the wound with normal saline solution.  Once this was done  and I was satisfied with the overall hemostasis, the rest of the wound was closed in layers using 2-0 Vicryl and a 4-0 Monocryl.  The lateral hip wound was then cleaned, dry, and dressed sterilely using  surgical glue and Aquacel dressing.  The patient  was brought to the recovery room in stable condition, tolerating the procedure well.   PUS D: 10/07/2023 8:24:04 am T: 10/07/2023 8:50:00 am  JOB: [id number]/ [id number]

## 2023-10-07 NOTE — Progress Notes (Signed)
 PT Note  Patient Details Name: April Hardy MRN: [id number] DOB: [birth year]     Quick screen with pt. Pt had been up already with nursing with RW to mobilize to the bathroom. She does not have any concerns. Just quickly addressed how to adjust hr mom's RW to her height, how her husband will provide assist going into house on 2 steps ( demonstrated ), educated to favor the L LE weight bearing to tolerance, let pain be the guidance. Continue to ice, limit hip abduction to the side, but okay to do hip flexion. Really not limitations per Dr. Charlann Boxer, just taking it easy for first few days. She is going to use the RW for first bit. Also educated to lie flat and stretch hip out occasionally to decrease stiffness.   All education complete, and pt feels comfortable with how to progress mobility and LLE.   Any further PT needs will be guided by MD.    Marella Bile 10/07/2023, 11:17 AM Clois Dupes, PT, MPT Acute Rehabilitation Services Office: [phone] If a weekend: secure chat groups: [facility] PT, [facility] OT, [facility] SLP 10/07/2023

## 2023-10-07 NOTE — Transfer of Care (Signed)
 Immediate Anesthesia Transfer of Care Note  Patient: April Hardy  Procedure(s) Performed: Left hip bursectomy, Iliotibial band rescession (Left: Hip)  Patient Location: PACU  Anesthesia Type:General  Level of Consciousness: awake, alert , and patient cooperative  Airway & Oxygen Therapy: Patient Spontanous Breathing and Patient connected to face mask oxygen  Post-op Assessment: Report given to RN and Post -op Vital signs reviewed and stable  Post vital signs: Reviewed and stable  Last Vitals:  Vitals Value Taken Time  BP 114/71 10/07/23 0834  Temp 36.4 C 10/07/23 0834  Pulse 85 10/07/23 0839  Resp 15 10/07/23 0839  SpO2 95 % 10/07/23 0839  Vitals shown include unfiled device data.  Last Pain:  Vitals:   10/07/23 0603  TempSrc:   PainSc: 3       Patients Stated Pain Goal: 3 (10/07/23 0603)  Complications: No notable events documented.

## 2023-10-07 NOTE — Anesthesia Procedure Notes (Signed)
 Procedure Name: LMA Insertion Date/Time: 10/07/2023 7:44 AM  Performed by: Darlina Guys, CRNAPre-anesthesia Checklist: Patient identified, Emergency Drugs available, Suction available and Patient being monitored Patient Re-evaluated:Patient Re-evaluated prior to induction Oxygen Delivery Method: Circle System Utilized Preoxygenation: Pre-oxygenation with 100% oxygen Induction Type: IV induction LMA: LMA inserted LMA Size: 4.0 Number of attempts: 1 Airway Equipment and Method: Bite block Placement Confirmation: positive ETCO2 Tube secured with: Tape Dental Injury: Teeth and Oropharynx as per pre-operative assessment  Comments: Atraumatic induction/LMA insertion. Dentition and oral mucosa as per preop

## 2023-10-07 NOTE — Interval H&P Note (Signed)
 History and Physical Interval Note:  10/07/2023 7:02 AM  April Hardy  has presented today for surgery, with the diagnosis of Left troch bursistis, lateral friction syndrome.  The various methods of treatment have been discussed with the patient and family. After consideration of risks, benefits and other options for treatment, the patient has consented to  Procedure(s): Left hip bursectomy, Iliotibial band rescession (Left) as a surgical intervention.  The patient's history has been reviewed, patient examined, no change in status, stable for surgery.  I have reviewed the patient's chart and labs.  Questions were answered to the patient's satisfaction.     [name]

## 2023-10-07 NOTE — Discharge Instructions (Signed)
INSTRUCTIONS AFTER SURGERY  Remove items at home which could result in a fall. This includes throw rugs or furniture in walking pathways ICE to the affected joint every three hours while awake for 30 minutes at a time, for at least the first 3-5 days, and then as needed for pain and swelling.  Continue to use ice for pain and swelling. You may notice swelling that will progress down to the foot and ankle.  This is normal after surgery.  Elevate your leg when you are not up walking on it.   Continue to use the breathing machine you got in the hospital (incentive spirometer) which will help keep your temperature down.  It is common for your temperature to cycle up and down following surgery, especially at night when you are not up moving around and exerting yourself.  The breathing machine keeps your lungs expanded and your temperature down.   DIET:  As you were doing prior to hospitalization, we recommend a well-balanced diet.  DRESSING / WOUND CARE / SHOWERING  Keep the surgical dressing until follow up.  The dressing is water proof, so you can shower without any extra covering.  IF THE DRESSING FALLS OFF or the wound gets wet inside, change the dressing with sterile gauze.  Please use good hand washing techniques before changing the dressing.  Do not use any lotions or creams on the incision until instructed by your surgeon.    ACTIVITY  Increase activity slowly as tolerated, but follow the weight bearing instructions below.   No driving for 6 weeks or until further direction given by your physician.  You cannot drive while taking narcotics.  No lifting or carrying greater than 10 lbs. until further directed by your surgeon. Avoid periods of inactivity such as sitting longer than an hour when not asleep. This helps prevent blood clots.  You may return to work once you are authorized by your doctor.     WEIGHT BEARING   Weight bearing as tolerated with assist device (walker, cane, etc) as  directed, use it as long as suggested by your surgeon or therapist, typically at least 4-6 weeks.  CONSTIPATION  Constipation is defined medically as fewer than three stools per week and severe constipation as less than one stool per week.  Even if you have a regular bowel pattern at home, your normal regimen is likely to be disrupted due to multiple reasons following surgery.  Combination of anesthesia, postoperative narcotics, change in appetite and fluid intake all can affect your bowels.   YOU MUST use at least one of the following options; they are listed in order of increasing strength to get the job done.  They are all available over the counter, and you may need to use some, POSSIBLY even all of these options:    Drink plenty of fluids (prune juice may be helpful) and high fiber foods Colace 100 mg by mouth twice a day  Senokot for constipation as directed and as needed Dulcolax (bisacodyl), take with full glass of water  Miralax (polyethylene glycol) once or twice a day as needed.  If you have tried all these things and are unable to have a bowel movement in the first 3-4 days after surgery call either your surgeon or your primary doctor.    If you experience loose stools or diarrhea, hold the medications until you stool forms back up.  If your symptoms do not get better within 1 week or if they get worse, check with  your doctor.  If you experience "the worst abdominal pain ever" or develop nausea or vomiting, please contact the office immediately for further recommendations for treatment.   ITCHING:  If you experience itching with your medications, try taking only a single pain pill, or even half a pain pill at a time.  You can also use Benadryl over the counter for itching or also to help with sleep.   TED HOSE STOCKINGS:  Use stockings on both legs until for at least 2 weeks or as directed by physician office. They may be removed at night for sleeping.  MEDICATIONS:  See your  medication summary on the "After Visit Summary" that nursing will review with you.  You may have some home medications which will be placed on hold until you complete the course of blood thinner medication.  It is important for you to complete the blood thinner medication as prescribed.  PRECAUTIONS:  If you experience chest pain or shortness of breath - call 911 immediately for transfer to the hospital emergency department.   If you develop a fever greater that 101 F, purulent drainage from wound, increased redness or drainage from wound, foul odor from the wound/dressing, or calf pain - CONTACT YOUR SURGEON.                                                   FOLLOW-UP APPOINTMENTS:  If you do not already have a post-op appointment, please call the office for an appointment to be seen by your surgeon.  Guidelines for how soon to be seen are listed in your "After Visit Summary", but are typically between 1-4 weeks after surgery.  POST-OPERATIVE OPIOID TAPER INSTRUCTIONS: It is important to wean off of your opioid medication as soon as possible. If you do not need pain medication after your surgery it is ok to stop day one. Opioids include: Codeine, Hydrocodone(Norco, Vicodin), Oxycodone(Percocet, oxycontin) and hydromorphone amongst others.  Long term and even short term use of opiods can cause: Increased pain response Dependence Constipation Depression Respiratory depression And more.  Withdrawal symptoms can include Flu like symptoms Nausea, vomiting And more Techniques to manage these symptoms Hydrate well Eat regular healthy meals Stay active Use relaxation techniques(deep breathing, meditating, yoga) Do Not substitute Alcohol to help with tapering If you have been on opioids for less than two weeks and do not have pain than it is ok to stop all together.  Plan to wean off of opioids This plan should start within one week post op of your joint replacement. Maintain the same  interval or time between taking each dose and first decrease the dose.  Cut the total daily intake of opioids by one tablet each day Next start to increase the time between doses. The last dose that should be eliminated is the evening dose.   MAKE SURE YOU:  Understand these instructions.  Get help right away if you are not doing well or get worse.    Thank you for letting us be a part of your medical care team.  It is a privilege we respect greatly.  We hope these instructions will help you stay on track for a fast and full recovery!

## 2023-10-07 NOTE — Anesthesia Preprocedure Evaluation (Signed)
 Anesthesia Evaluation  Patient identified by MRN, date of birth, ID band Patient awake    Reviewed: Allergy & Precautions, NPO status , Patient's Chart, lab work & pertinent test results  History of Anesthesia Complications Negative for: history of anesthetic complications  Airway Mallampati: III  TM Distance: >3 FB Neck ROM: Full    Dental  (+) Teeth Intact, Dental Advisory Given   Pulmonary neg shortness of breath, neg sleep apnea, neg COPD, neg recent URI, Current Smoker and Patient abstained from smoking.   breath sounds clear to auscultation       Cardiovascular negative cardio ROS  Rhythm:Regular     Neuro/Psych  PSYCHIATRIC DISORDERS Anxiety Depression    negative neurological ROS     GI/Hepatic Neg liver ROS,GERD  Medicated and Controlled,,  Endo/Other  negative endocrine ROS    Renal/GU negative Renal ROS     Musculoskeletal  (+) Arthritis ,    Abdominal   Peds  Hematology negative hematology ROS (+) Lab Results      Component                Value               Date                      WBC                      9.0                 09/27/2023                HGB                      12.0                09/27/2023                HCT                      36.3                09/27/2023                MCV                      97.8                09/27/2023                PLT                      340                 09/27/2023              Anesthesia Other Findings   Reproductive/Obstetrics                             Anesthesia Physical Anesthesia Plan  ASA: 2  Anesthesia Plan: General   Post-op Pain Management: Ofirmev IV (intra-op)* and Toradol IV (intra-op)*   Induction: Intravenous  PONV Risk Score and Plan: 2 and Ondansetron, Dexamethasone and Midazolam  Airway Management Planned: LMA  Additional Equipment: None  Intra-op Plan:   Post-operative Plan: Extubation in  OR  Informed Consent: I have reviewed the  patients History and Physical, chart, labs and discussed the procedure including the risks, benefits and alternatives for the proposed anesthesia with the patient or authorized representative who has indicated his/her understanding and acceptance.     Dental advisory given  Plan Discussed with: CRNA  Anesthesia Plan Comments:        Anesthesia Quick Evaluation

## 2023-10-07 NOTE — Brief Op Note (Signed)
 10/07/2023  8:18 AM  PATIENT:  April Hardy  53 y.o. female  PRE-OPERATIVE DIAGNOSIS:  Left troch bursistis, lateral friction syndrome  POST-OPERATIVE DIAGNOSIS:  Left troch bursistis, lateral friction syndrome  PROCEDURE:  Procedure(s): Left hip bursectomy, Iliotibial band rescession (Left)  SURGEON:  Surgeons and Role:    Durene Romans, MD - Primary  PHYSICIAN ASSISTANT: Rosalene Billings, PA-C  ANESTHESIA:   general  EBL:  <10 cc  BLOOD ADMINISTERED:none  DRAINS: none   LOCAL MEDICATIONS USED:  NONE  SPECIMEN:  No Specimen  DISPOSITION OF SPECIMEN:  N/A  COUNTS:  YES  TOURNIQUET:  * No tourniquets in log *  DICTATION: .Other Dictation: Dictation Number [id number]  PLAN OF CARE: Discharge to home after PACU  PATIENT DISPOSITION:  PACU - hemodynamically stable.   Delay start of Pharmacological VTE agent (>24hrs) due to surgical blood loss or risk of bleeding: not applicable

## 2023-10-08 ENCOUNTER — Encounter (HOSPITAL_COMMUNITY): Payer: Self-pay | Admitting: Orthopedic Surgery

## 2023-10-08 NOTE — Anesthesia Postprocedure Evaluation (Signed)
 Anesthesia Post Note  Patient: April Hardy  Procedure(s) Performed: Left hip bursectomy, Iliotibial band rescession (Left: Hip)     Patient location during evaluation: PACU Anesthesia Type: General Level of consciousness: awake and alert Pain management: pain level controlled Vital Signs Assessment: post-procedure vital signs reviewed and stable Respiratory status: spontaneous breathing, nonlabored ventilation and respiratory function stable Cardiovascular status: blood pressure returned to baseline and stable Postop Assessment: no apparent nausea or vomiting Anesthetic complications: no   No notable events documented.                  Dajanae Brophy

## 2023-12-05 ENCOUNTER — Ambulatory Visit
Admission: RE | Admit: 2023-12-05 | Discharge: 2023-12-05 | Disposition: A | Discharge status: Home / Self Care | Payer: Self-pay | Source: Ambulatory Visit | Attending: Obstetrics and Gynecology | Admitting: Obstetrics and Gynecology

## 2023-12-05 DIAGNOSIS — Z803 Family history of malignant neoplasm of breast: Secondary | ICD-10-CM

## 2023-12-05 MED ORDER — GADOPICLENOL 0.5 MMOL/ML IV SOLN
7.0000 mL | Freq: Once | INTRAVENOUS | Status: AC | PRN
  Administered 2023-12-05: 7 mL via INTRAVENOUS

## 2023-12-08 ENCOUNTER — Other Ambulatory Visit (HOSPITAL_BASED_OUTPATIENT_CLINIC_OR_DEPARTMENT_OTHER): Payer: Self-pay

## 2024-06-08 ENCOUNTER — Other Ambulatory Visit: Payer: Self-pay | Admitting: Obstetrics and Gynecology

## 2024-06-08 DIAGNOSIS — Z9189 Other specified personal risk factors, not elsewhere classified: Secondary | ICD-10-CM

## 2024-06-15 ENCOUNTER — Other Ambulatory Visit: Payer: Self-pay | Admitting: Medical Genetics

## 2024-06-21 ENCOUNTER — Other Ambulatory Visit: Payer: Self-pay

## 2024-06-21 DIAGNOSIS — Z006 Encounter for examination for normal comparison and control in clinical research program: Secondary | ICD-10-CM

## 2024-06-30 LAB — GENECONNECT MOLECULAR SCREEN: Genetic Analysis Overall Interpretation: NEGATIVE

## 2024-07-12 ENCOUNTER — Other Ambulatory Visit: Payer: Self-pay | Admitting: Internal Medicine

## 2024-07-12 DIAGNOSIS — F17200 Nicotine dependence, unspecified, uncomplicated: Secondary | ICD-10-CM

## 2024-07-17 ENCOUNTER — Encounter: Payer: Self-pay | Admitting: Internal Medicine

## 2024-07-21 ENCOUNTER — Inpatient Hospital Stay: Admission: RE | Admit: 2024-07-21

## 2024-07-24 ENCOUNTER — Ambulatory Visit
Admission: RE | Admit: 2024-07-24 | Discharge: 2024-07-24 | Disposition: A | Source: Ambulatory Visit | Attending: Internal Medicine | Admitting: Internal Medicine

## 2024-07-24 DIAGNOSIS — F17200 Nicotine dependence, unspecified, uncomplicated: Secondary | ICD-10-CM

## 2024-08-24 ENCOUNTER — Ambulatory Visit: Payer: Self-pay | Admitting: Podiatry

## 2024-09-08 ENCOUNTER — Ambulatory Visit: Payer: Self-pay | Admitting: Podiatry

## 2024-12-05 ENCOUNTER — Other Ambulatory Visit
